# Patient Record
Sex: Female | Born: 2005 | Hispanic: Yes | Marital: Single | Smoking: Never smoker
Health system: Southern US, Community
[De-identification: ages and names within clinical notes are randomized; demographics above are authoritative.]

## PROBLEM LIST (undated history)

## (undated) ENCOUNTER — Inpatient Hospital Stay (HOSPITAL_COMMUNITY): Payer: Self-pay

## (undated) ENCOUNTER — Emergency Department: Admission: EM | Payer: Medicaid Other | Source: Home / Self Care

## (undated) ENCOUNTER — Inpatient Hospital Stay: Payer: Self-pay

## (undated) DIAGNOSIS — J45909 Unspecified asthma, uncomplicated: Secondary | ICD-10-CM

## (undated) DIAGNOSIS — B9689 Other specified bacterial agents as the cause of diseases classified elsewhere: Secondary | ICD-10-CM

## (undated) HISTORY — DX: Unspecified asthma, uncomplicated: J45.909

## (undated) HISTORY — PX: SKIN TAG REMOVAL: SHX780

## (undated) HISTORY — PX: TONSILLECTOMY: SUR1361

---
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
## 2023-05-23 ENCOUNTER — Other Ambulatory Visit: Payer: Self-pay | Admitting: Obstetrics and Gynecology

## 2023-05-23 ENCOUNTER — Encounter: Payer: Self-pay | Admitting: Obstetrics and Gynecology

## 2023-05-23 MED ORDER — MISOPROSTOL 100 MCG PO TABS: 100.0000 ug | ORAL_TABLET | Freq: Once | ORAL | 0 refills | Status: DC

## 2023-05-23 NOTE — Progress Notes (Signed)
Rx cytotec for iUD placement

## 2023-05-24 ENCOUNTER — Ambulatory Visit (INDEPENDENT_AMBULATORY_CARE_PROVIDER_SITE_OTHER): Payer: MEDICAID | Admitting: Obstetrics and Gynecology

## 2023-05-24 ENCOUNTER — Encounter: Payer: Self-pay | Admitting: Obstetrics and Gynecology

## 2023-05-24 ENCOUNTER — Other Ambulatory Visit (HOSPITAL_COMMUNITY)
Admission: RE | Admit: 2023-05-24 | Discharge: 2023-05-24 | Disposition: A | Discharge status: Home / Self Care | Payer: MEDICAID | Source: Ambulatory Visit | Attending: Obstetrics and Gynecology | Admitting: Obstetrics and Gynecology

## 2023-05-24 VITALS — BP 106/72 | Ht 61.0 in | Wt 149.0 lb

## 2023-05-24 DIAGNOSIS — Z30430 Encounter for insertion of intrauterine contraceptive device: Secondary | ICD-10-CM

## 2023-05-24 DIAGNOSIS — Z113 Encounter for screening for infections with a predominantly sexual mode of transmission: Secondary | ICD-10-CM | POA: Diagnosis present

## 2023-05-24 MED ORDER — PARAGARD INTRAUTERINE COPPER IU IUD
1.0000 | INTRAUTERINE_SYSTEM | Freq: Once | INTRAUTERINE | Status: AC
  Administered 2023-05-24: 1 via INTRAUTERINE

## 2023-05-24 NOTE — Telephone Encounter (Signed)
Called pt and she is aware  ?

## 2023-05-24 NOTE — Patient Instructions (Addendum)
I value your feedback and you entrusting us with your care. If you get a [facility] patient survey, I would appreciate you taking the time to let us know about your experience today. Thank you!  Instructions after IUD insertion  Most women experience no significant problems after insertion of an IUD, however minor cramping and spotting for a few days is common. Cramps may be treated with ibuprofen 800mg every 8 hours or Tylenol 650 mg every 4 hours. Contact [facility] immediately if you experience any of the following symptoms during the next week: temperature >99.6 degrees, worsening pelvic pain, abdominal pain, fainting, unusually heavy vaginal bleeding, foul vaginal discharge, or if you think you have expelled the IUD. Nothing inserted in the vagina for 48 hours. You will be scheduled for a follow up visit in approximately four weeks.    

## 2023-05-24 NOTE — Progress Notes (Signed)
   Chief Complaint  Patient presents with   IUD insertion     IUD PROCEDURE NOTE:  Karen Choi is a 17 y.o. G0P0000 here for Paragard  IUD insertion for [facility]. Pt is sexually active with new partner, usually using condoms. Menses are monthly, lasting 5-6 days. Pelvic pain from 3/24 resolved. Pt thinks more menstrual cramp related. Plans to go in army.   BP 106/72   Ht 5\' 1"  (1.549 m)   Wt 149 lb (67.6 kg)   LMP 05/21/2023 (Exact Date)   BMI 28.15 kg/m   IUD Insertion Procedure Note Patient identified, informed consent performed, consent signed.   Discussed risks of irregular bleeding, cramping, infection, malpositioning or misplacement of the IUD outside the uterus which may require further procedure such as laparoscopy, risk of failure <1%. Time out was performed.  Speculum placed in the vagina.  Cervix visualized.  Cleaned with Betadine x 2.  Grasped anteriorly with a single tooth tenaculum.  Uterus sounded to 8.0 cm.   IUD placed per manufacturer's recommendations.  Strings trimmed to 3 cm. Tenaculum was removed, good hemostasis noted.  Patient tolerated procedure well.   ASSESSMENT:  Encounter for insertion of ParaGard IUD - Plan: paragard intrauterine copper IUD 1 each  Screening for STD (sexually transmitted disease) - Plan: Cervicovaginal ancillary only   Meds ordered this encounter  Medications   paragard intrauterine copper IUD 1 each     Plan:  Patient was given post-procedure instructions.  Karen Choi was advised to have backup contraception for one week.   Call if you are having increasing pain, cramps or bleeding or if you have a fever greater than 100.4 degrees F., shaking chills, nausea or vomiting. Patient was also asked to check IUD strings periodically and follow up in 4 weeks for IUD check.  Return in about 4 weeks (around 06/21/2023) for IUD f/u.  Lorrin Bodner B. Savon Cobbs, PA-C 05/24/2023 3:38 PM

## 2023-05-26 LAB — CERVICOVAGINAL ANCILLARY ONLY
Chlamydia: NEGATIVE
Comment: NEGATIVE
Comment: NORMAL
Neisseria Gonorrhea: NEGATIVE

## 2023-06-15 NOTE — Progress Notes (Unsigned)
   No chief complaint on file.    History of Present Illness:  Karen Choi is a 17 y.o. that had a Paragard IUD placed approximately 1 month ago. Since that time, she denies dyspareunia, pelvic pain, non-menstrual bleeding, vaginal d/c, heavy bleeding.   Review of Systems  Physical Exam:  LMP 05/21/2023 (Exact Date)  There is no height or weight on file to calculate BMI.  Pelvic exam:  Two IUD strings {DESC; PRESENT/ABSENT:17923} seen coming from the cervical os. EGBUS, vaginal vault and cervix: within normal limits   Assessment:   No diagnosis found.  IUD strings present in proper location; pt doing well  Plan: F/u if any signs of infection or can no longer feel the strings.   Karen Corwin B. Anacaren Kohan, PA-C 06/15/2023 3:02 PM

## 2023-06-16 ENCOUNTER — Encounter: Payer: Self-pay | Admitting: Obstetrics and Gynecology

## 2023-06-16 ENCOUNTER — Ambulatory Visit (INDEPENDENT_AMBULATORY_CARE_PROVIDER_SITE_OTHER): Payer: MEDICAID | Admitting: Obstetrics and Gynecology

## 2023-06-16 VITALS — BP 100/78 | Ht 61.0 in | Wt 151.0 lb

## 2023-06-16 DIAGNOSIS — R350 Frequency of micturition: Secondary | ICD-10-CM | POA: Diagnosis not present

## 2023-06-16 DIAGNOSIS — R1032 Left lower quadrant pain: Secondary | ICD-10-CM | POA: Diagnosis not present

## 2023-06-16 DIAGNOSIS — Z30431 Encounter for routine checking of intrauterine contraceptive device: Secondary | ICD-10-CM | POA: Diagnosis not present

## 2023-06-16 DIAGNOSIS — Z3202 Encounter for pregnancy test, result negative: Secondary | ICD-10-CM

## 2023-06-16 LAB — POCT URINALYSIS DIPSTICK
Bilirubin, UA: NEGATIVE
Blood, UA: NEGATIVE
Glucose, UA: NEGATIVE
Ketones, UA: NEGATIVE
Nitrite, UA: NEGATIVE
Protein, UA: NEGATIVE
Spec Grav, UA: 1.015 (ref 1.010–1.025)
pH, UA: 7 (ref 5.0–8.0)

## 2023-06-16 LAB — POCT URINE PREGNANCY: Preg Test, Ur: NEGATIVE

## 2023-06-16 NOTE — Patient Instructions (Signed)
I value your feedback and you entrusting us with your care. If you get a [facility] patient survey, I would appreciate you taking the time to let us know about your experience today. Thank you! ? ? ?

## 2023-06-18 LAB — URINE CULTURE

## 2023-06-21 ENCOUNTER — Ambulatory Visit: Payer: MEDICAID | Admitting: Obstetrics and Gynecology

## 2023-10-28 ENCOUNTER — Telehealth: Payer: Self-pay

## 2023-10-28 NOTE — Telephone Encounter (Signed)
Pt calling triage asking if a Rx needs to be called in for IUD removal. Advised no.

## 2023-11-11 ENCOUNTER — Ambulatory Visit: Payer: MEDICAID

## 2023-11-11 VITALS — BP 116/78 | HR 109 | Ht 61.0 in | Wt 137.6 lb

## 2023-11-11 DIAGNOSIS — Z30432 Encounter for removal of intrauterine contraceptive device: Secondary | ICD-10-CM | POA: Diagnosis not present

## 2023-11-11 DIAGNOSIS — Z3009 Encounter for other general counseling and advice on contraception: Secondary | ICD-10-CM

## 2023-11-11 MED ORDER — NORETHIN ACE-ETH ESTRAD-FE 1-20 MG-MCG PO TABS
1.0000 | ORAL_TABLET | Freq: Every day | ORAL | 11 refills | Status: DC
Start: 2023-11-11 — End: 2024-01-30

## 2023-11-11 NOTE — Progress Notes (Signed)
    GYNECOLOGY CLINIC PROCEDURE NOTE  [name] is a 18 y.o. G0P0000 here for Paragard IUD removal. No GYN concerns.    IUD Removal  Patient was in the dorsal lithotomy position, normal external genitalia was noted.  A speculum was placed in the patient's vagina, normal discharge was noted, no lesions. The multiparous cervix was visualized, no lesions, no abnormal discharge.  The strings of the IUD were grasped and pulled using ring forceps. The IUD was removed in its entirety.   Patient tolerated the procedure well.    Patient will use COCs for contraception. She has previously used COCs and was happy with them. Provided with prescription for Junel and reviewed need for back up contraception for 7 days.   Burney Gauze, [place] 11/11/2023 10:57 AM

## 2024-01-30 ENCOUNTER — Other Ambulatory Visit (HOSPITAL_COMMUNITY)
Admission: RE | Admit: 2024-01-30 | Discharge: 2024-01-30 | Disposition: A | Discharge status: Home / Self Care | Payer: MEDICAID | Source: Ambulatory Visit

## 2024-01-30 ENCOUNTER — Ambulatory Visit: Payer: MEDICAID

## 2024-01-30 VITALS — BP 104/70 | HR 81 | Ht 61.0 in | Wt 133.8 lb

## 2024-01-30 DIAGNOSIS — N898 Other specified noninflammatory disorders of vagina: Secondary | ICD-10-CM

## 2024-01-30 DIAGNOSIS — Z3201 Encounter for pregnancy test, result positive: Secondary | ICD-10-CM | POA: Diagnosis not present

## 2024-01-30 DIAGNOSIS — R350 Frequency of micturition: Secondary | ICD-10-CM | POA: Diagnosis not present

## 2024-01-30 DIAGNOSIS — N912 Amenorrhea, unspecified: Secondary | ICD-10-CM

## 2024-01-30 LAB — POCT URINE PREGNANCY: Preg Test, Ur: POSITIVE — AB

## 2024-01-30 LAB — POCT URINALYSIS DIPSTICK
Bilirubin, UA: NEGATIVE
Blood, UA: NEGATIVE
Glucose, UA: NEGATIVE
Ketones, UA: NEGATIVE
Leukocytes, UA: NEGATIVE
Nitrite, UA: NEGATIVE
Protein, UA: NEGATIVE
Spec Grav, UA: 1.01 (ref 1.010–1.025)
Urobilinogen, UA: 0.2 U/dL
pH, UA: 7 (ref 5.0–8.0)

## 2024-01-30 NOTE — Addendum Note (Signed)
 Addended by: Juanita Norlander on: 01/30/2024 11:41 AM   Modules accepted: Orders

## 2024-01-30 NOTE — Progress Notes (Signed)
    NURSE VISIT NOTE  Subjective:    Patient ID: Kennedi E Sanchez Rivera, female    DOB: 2005-12-17, 18 y.o.   MRN: [id number]  HPI  Patient is a 18 y.o. G0P0000 female who presents for evaluation of amenorrhea. She believes she could be pregnant. Pregnancy is not desired. Sexual Activity: has sex with males. Current symptoms also include: breast tenderness, urinary frequency, moodiness and fatigue.  LMP 12/25/23 Last period was abnormal - only 4 days long which is short for patient.  She also reports a foul vaginal odor and urinary frequency.    Objective:    BP 104/70   Pulse 81   Ht 5\' 1"  (1.549 m)   Wt 133 lb 12.8 oz (60.7 kg)   LMP 12/25/2023 (Exact Date)   BMI 25.28 kg/m   Lab Review  Results for orders placed or performed in visit on 01/30/24  POCT urine pregnancy  Result Value Ref Range   Preg Test, Ur Positive (A) Negative  POCT Urinalysis Dipstick  Result Value Ref Range   Color, UA     Clarity, UA     Glucose, UA Negative Negative   Bilirubin, UA negative    Ketones, UA negative    Spec Grav, UA 1.010 1.010 - 1.025   Blood, UA negative    pH, UA 7.0 5.0 - 8.0   Protein, UA Negative Negative   Urobilinogen, UA 0.2 0.2 or 1.0 E.U./dL   Nitrite, UA negative    Leukocytes, UA Negative Negative   Appearance     Odor      Assessment:   1. Amenorrhea   2. Vaginal odor   3. Urinary frequency     Plan:   Pregnancy Test: Positive  Estimated Date of Delivery: 09/30/24 Patient is unsure whether she wants to continue the pregnancy.  She is 5 weeks and 1 day by her LMP.  Counseled on pregnancy options.  Information provided for local abortion clinics.  Reviewed state law with patient - she understands she will need to decide by 12 weeks and 6 days if she would like to terminate in state.  Discussed risk reduction in pregnancy - taking a prenatal vitamin, not smoking or drinking or taking OTC medications until consulting with a provider or pharmacist until she  decides whether she would like to continue the pregnancy.  If she decides to continue, she will schedule her nurse visit @ 7-[redacted] wks pregnant, u/s for dating @10  wk, and NOB visit at [redacted] wk pregnant.     Vaginal odor and Urinary frequency: UA performed in clinic - within normal limits. Aptima Swab sent out.  Will call patient with any abnormal results.   Juanita Norlander, RN

## 2024-01-31 ENCOUNTER — Encounter (HOSPITAL_COMMUNITY): Payer: Self-pay

## 2024-01-31 ENCOUNTER — Other Ambulatory Visit: Payer: Self-pay

## 2024-01-31 ENCOUNTER — Inpatient Hospital Stay (HOSPITAL_COMMUNITY)
Admission: AD | Admit: 2024-01-31 | Discharge: 2024-01-31 | Disposition: A | Discharge status: Home / Self Care | Payer: MEDICAID | Attending: Obstetrics and Gynecology | Admitting: Obstetrics and Gynecology

## 2024-01-31 DIAGNOSIS — O26891 Other specified pregnancy related conditions, first trimester: Secondary | ICD-10-CM | POA: Diagnosis not present

## 2024-01-31 DIAGNOSIS — O23591 Infection of other part of genital tract in pregnancy, first trimester: Secondary | ICD-10-CM | POA: Diagnosis not present

## 2024-01-31 DIAGNOSIS — R109 Unspecified abdominal pain: Secondary | ICD-10-CM

## 2024-01-31 DIAGNOSIS — B9689 Other specified bacterial agents as the cause of diseases classified elsewhere: Secondary | ICD-10-CM | POA: Insufficient documentation

## 2024-01-31 DIAGNOSIS — Z3A01 Less than 8 weeks gestation of pregnancy: Secondary | ICD-10-CM | POA: Diagnosis not present

## 2024-01-31 DIAGNOSIS — R102 Pelvic and perineal pain: Secondary | ICD-10-CM | POA: Insufficient documentation

## 2024-01-31 LAB — CERVICOVAGINAL ANCILLARY ONLY
Bacterial Vaginitis (gardnerella): POSITIVE — AB
Candida Glabrata: NEGATIVE
Candida Vaginitis: NEGATIVE
Chlamydia: NEGATIVE
Comment: NEGATIVE
Comment: NEGATIVE
Comment: NEGATIVE
Comment: NEGATIVE
Comment: NEGATIVE
Comment: NORMAL
Neisseria Gonorrhea: NEGATIVE
Trichomonas: NEGATIVE

## 2024-01-31 LAB — URINALYSIS, ROUTINE W REFLEX MICROSCOPIC
Bacteria, UA: NONE SEEN
Bilirubin Urine: NEGATIVE
Glucose, UA: NEGATIVE mg/dL
Hgb urine dipstick: NEGATIVE
Ketones, ur: 20 mg/dL — AB
Leukocytes,Ua: NEGATIVE
Nitrite: NEGATIVE
Protein, ur: NEGATIVE mg/dL
Specific Gravity, Urine: 1.023 (ref 1.005–1.030)
pH: 6 (ref 5.0–8.0)

## 2024-01-31 LAB — WET PREP, GENITAL
Sperm: NONE SEEN
Trich, Wet Prep: NONE SEEN
WBC, Wet Prep HPF POC: <10 (ref <10)
Yeast Wet Prep HPF POC: NONE SEEN

## 2024-01-31 MED ORDER — METRONIDAZOLE 500 MG PO TABS: 500.0000 mg | ORAL_TABLET | Freq: Two times a day (BID) | ORAL | 0 refills | Status: DC

## 2024-01-31 NOTE — [facility]U Note (Signed)
 Karen Choi is a 18 y.o. at [redacted]w[redacted]d here in [facility]U reporting: cramping after pregnancy test yesterday. Denies VB. Reports white creamy discharge. Patient very tearful, unsure if she wants to keep baby. She states she doesn't know how far along she is because her last period was shorter than usual.   LMP: 12/25/2023  Pain score: 7/10 Vitals:   01/31/24 1634  BP: 118/75  Pulse: 95  Temp: 98.4 F (36.9 C)  SpO2: 100%      Lab orders placed from triage: UA, swabs

## 2024-01-31 NOTE — MAU Provider Note (Signed)
 None     S [name] is a 18 y.o. G1P0000 pregnant/non-pregnant female at [redacted]w[redacted]d who presents to MAU today with complaint of abdominal cramping. Saw [name] yesterday for amenorrhea evaluation and found out she is pregnant. Reports vaginal odor/discharge as well.  Receives care at [facility]. Prenatal records reviewed.  Pertinent items noted in HPI and remainder of comprehensive ROS otherwise negative.   O BP 118/75 (BP Location: Right Arm)   Pulse 95   Temp 98.4 F (36.9 C) (Oral)   LMP 12/25/2023 (Exact Date)   SpO2 100%  Physical Exam Vitals reviewed.  Constitutional:      General: She is not in acute distress.    Appearance: She is well-developed. She is not diaphoretic.  Eyes:     General: No scleral icterus. Pulmonary:     Effort: Pulmonary effort is normal. No respiratory distress.  Skin:    General: Skin is warm and dry.  Neurological:     Mental Status: She is alert.     Coordination: Coordination normal.  Psychiatric:        Mood and Affect: Affect is tearful.    Results for orders placed or performed during the hospital encounter of 01/31/24 (from the past 24 hours)  Urinalysis, Routine w reflex microscopic -Urine, Clean Catch     Status: Abnormal   Collection Time: 01/31/24  4:37 PM  Result Value Ref Range   Color, Urine YELLOW YELLOW   APPearance HAZY (A) CLEAR   Specific Gravity, Urine 1.023 1.005 - 1.030   pH 6.0 5.0 - 8.0   Glucose, UA NEGATIVE NEGATIVE mg/dL   Hgb urine dipstick NEGATIVE NEGATIVE   Bilirubin Urine NEGATIVE NEGATIVE   Ketones, ur 20 (A) NEGATIVE mg/dL   Protein, ur NEGATIVE NEGATIVE mg/dL   Nitrite NEGATIVE NEGATIVE   Leukocytes,Ua NEGATIVE NEGATIVE   RBC / HPF 0-5 0 - 5 RBC/hpf   WBC, UA 0-5 0 - 5 WBC/hpf   Bacteria, UA NONE SEEN NONE SEEN   Squamous Epithelial / HPF 0-5 0 - 5 /HPF   Mucus PRESENT   Wet prep, genital     Status: Abnormal   Collection Time: 01/31/24  4:38 PM   Specimen: Urine, Clean  Catch  Result Value Ref Range   Yeast Wet Prep HPF POC NONE SEEN NONE SEEN   Trich, Wet Prep NONE SEEN NONE SEEN   Clue Cells Wet Prep HPF POC PRESENT (A) NONE SEEN   WBC, Wet Prep HPF POC <10 <10   Sperm NONE SEEN     MDM: Low MAU Course: -Wet prep positive for BV. Treat with metronidazole .   A 1. Abdominal cramping (Primary) - Discharge patient  2. BV (bacterial vaginosis) - Discharge patient  3. [redacted] weeks gestation of pregnancy - Discharge patient   Medical screening exam complete  P Counseled on pregnancy dating based on LMP. Discharge from MAU in stable condition with first trimester return precautions. Follow up as directed by OBGYN.  No future appointments. Allergies as of 01/31/2024   No Known Allergies      Medication List     TAKE these medications    fluticasone 50 MCG/ACT nasal spray Commonly known as: FLONASE Place 2 sprays into both nostrils 2 (two) times daily.   metroNIDAZOLE  500 MG tablet Commonly known as: FLAGYL  Take 1 tablet (500 mg total) by mouth 2 (two) times daily.   Ventolin HFA 108 (90 Base) MCG/ACT inhaler Generic drug: albuterol   ZYRTEC  PO Take by mouth.       Noreene Bearded, PA

## 2024-01-31 NOTE — Discharge Instructions (Addendum)
 Please reach out to your OB if you have any questions. We are happy to do whatever we can for you as you decide what is right for you.   Pregnancy is dated based on the first day of your most recent period. This puts you at 5 weeks, 2 days as of today.

## 2024-02-01 LAB — GC/CHLAMYDIA PROBE AMP (~~LOC~~) NOT AT ARMC
Chlamydia: NEGATIVE
Comment: NEGATIVE
Comment: NORMAL
Neisseria Gonorrhea: NEGATIVE

## 2024-02-03 ENCOUNTER — Ambulatory Visit: Payer: Self-pay | Admitting: Obstetrics and Gynecology

## 2024-02-10 ENCOUNTER — Ambulatory Visit (INDEPENDENT_AMBULATORY_CARE_PROVIDER_SITE_OTHER): Payer: MEDICAID

## 2024-02-10 ENCOUNTER — Other Ambulatory Visit (HOSPITAL_COMMUNITY)
Admission: RE | Admit: 2024-02-10 | Discharge: 2024-02-10 | Disposition: A | Discharge status: Home / Self Care | Payer: MEDICAID | Source: Ambulatory Visit | Attending: Licensed Practical Nurse | Admitting: Licensed Practical Nurse

## 2024-02-10 VITALS — BP 107/72 | HR 84 | Ht 60.5 in | Wt 130.0 lb

## 2024-02-10 DIAGNOSIS — N898 Other specified noninflammatory disorders of vagina: Secondary | ICD-10-CM

## 2024-02-10 NOTE — Progress Notes (Signed)
    NURSE VISIT NOTE  Subjective:    Patient ID: Karen Choi, female    DOB: 2006/07/12, 18 y.o.   MRN: [id number]  HPI  Patient is a 18 y.o. G78P0000 female who presents for darker thick vaginal discharge for 1 day(s). RecentlyPatient denies history of known exposure to STD.   Objective:    BP 107/72   Pulse 84   Ht 5' 0.5" (1.537 m)   Wt 130 lb (59 kg)   LMP 12/25/2023 (Exact Date)   BMI 24.97 kg/m      Assessment:   1. Vaginal discharge   2. Vagina itching       Plan:   GC and chlamydia DNA  probe sent to lab. Treatment: OTC yeast cream such as Monistat or Gyne-Lotrimin and await for results for further treatment.  ROV prn if symptoms persist or worsen.   Vale Garrison, CMA

## 2024-02-13 ENCOUNTER — Encounter: Payer: Self-pay | Admitting: Obstetrics and Gynecology

## 2024-02-14 ENCOUNTER — Ambulatory Visit: Payer: Self-pay

## 2024-02-14 ENCOUNTER — Other Ambulatory Visit: Payer: Self-pay

## 2024-02-14 DIAGNOSIS — B379 Candidiasis, unspecified: Secondary | ICD-10-CM

## 2024-02-14 LAB — CERVICOVAGINAL ANCILLARY ONLY
Bacterial Vaginitis (gardnerella): NEGATIVE
Candida Glabrata: NEGATIVE
Candida Vaginitis: POSITIVE — AB
Chlamydia: NEGATIVE
Comment: NEGATIVE
Comment: NEGATIVE
Comment: NEGATIVE
Comment: NEGATIVE
Comment: NEGATIVE
Comment: NORMAL
Neisseria Gonorrhea: NEGATIVE
Trichomonas: NEGATIVE

## 2024-02-14 MED ORDER — FLUCONAZOLE 150 MG PO TABS: 150.0000 mg | ORAL_TABLET | Freq: Every day | ORAL | 0 refills | Status: DC

## 2024-03-05 ENCOUNTER — Other Ambulatory Visit (HOSPITAL_COMMUNITY)
Admission: RE | Admit: 2024-03-05 | Discharge: 2024-03-05 | Disposition: A | Discharge status: Home / Self Care | Payer: MEDICAID | Source: Ambulatory Visit | Attending: Obstetrics and Gynecology | Admitting: Obstetrics and Gynecology

## 2024-03-05 ENCOUNTER — Ambulatory Visit (INDEPENDENT_AMBULATORY_CARE_PROVIDER_SITE_OTHER): Payer: MEDICAID

## 2024-03-05 VITALS — BP 108/70 | HR 91 | Ht 61.5 in | Wt 128.4 lb

## 2024-03-05 DIAGNOSIS — N898 Other specified noninflammatory disorders of vagina: Secondary | ICD-10-CM

## 2024-03-05 NOTE — Progress Notes (Addendum)
    NURSE VISIT NOTE  Subjective:    Patient ID: Karen Choi, female    DOB: [birth year], 18 y.o.   MRN: [id number]  HPI  Patient is a 18 y.o. G58P0000 female who presents for  vaginal itching for 1.5 week(s). Denies abnormal vaginal bleeding or significant pelvic pain or fever.  Objective:    BP 108/70   Pulse 91   Ht 5' 1.5 (1.562 m)   Wt 128 lb 6.4 oz (58.2 kg)   LMP 12/25/2023 (Exact Date)   Breastfeeding Unknown   BMI 23.87 kg/m     Assessment:   1. Vaginal discharge   2. Vagina itching       Plan:   GC and chlamydia DNA  probe sent to lab. Treatment: await results for further treatment.  ROV prn if symptoms persist or worsen.   Harlene LITTIE Cisco, CNM

## 2024-03-06 LAB — CERVICOVAGINAL ANCILLARY ONLY
Bacterial Vaginitis (gardnerella): POSITIVE — AB
Candida Glabrata: NEGATIVE
Candida Vaginitis: NEGATIVE
Chlamydia: NEGATIVE
Comment: NEGATIVE
Comment: NEGATIVE
Comment: NEGATIVE
Comment: NEGATIVE
Comment: NEGATIVE
Comment: NORMAL
Neisseria Gonorrhea: NEGATIVE
Trichomonas: NEGATIVE

## 2024-03-07 ENCOUNTER — Ambulatory Visit: Payer: Self-pay

## 2024-03-07 ENCOUNTER — Other Ambulatory Visit: Payer: Self-pay

## 2024-03-07 DIAGNOSIS — B3731 Acute candidiasis of vulva and vagina: Secondary | ICD-10-CM

## 2024-03-07 MED ORDER — FLUCONAZOLE 150 MG PO TABS: 150.0000 mg | ORAL_TABLET | Freq: Once | ORAL | 0 refills | Status: AC

## 2024-03-08 MED ORDER — CLINDAMYCIN HCL 300 MG PO CAPS: 300.0000 mg | ORAL_CAPSULE | Freq: Two times a day (BID) | ORAL | 0 refills | Status: DC

## 2024-03-21 ENCOUNTER — Encounter: Payer: Self-pay | Admitting: Obstetrics and Gynecology

## 2024-03-21 ENCOUNTER — Ambulatory Visit: Payer: MEDICAID

## 2024-03-21 VITALS — BP 110/67 | HR 93 | Ht 60.5 in | Wt 130.0 lb

## 2024-03-21 DIAGNOSIS — N898 Other specified noninflammatory disorders of vagina: Secondary | ICD-10-CM

## 2024-03-21 NOTE — Progress Notes (Addendum)
    NURSE VISIT NOTE  Subjective:    Patient ID: Karen Choi, female    DOB: 10/24/2005, 18 y.o.   MRN: [id number]  HPI  Patient is a 18 y.o. G61P0000 female who presents for vaginal itching Denies discharge states no other symptom.Pt stated she went to abortion clinic on June 3rd states she hasn't had a normal period since was advised by office RN that can be normal, I let her know we would do the mycoplasma swab as she keeps getting Bv and itchy .Pt was also advised to schedule apt with provider as she states she does not  have a pcp, information for PCP was also given. Pt verbalized understanding.  Objective:    BP 110/67   Pulse 93   Ht 5' 0.5 (1.537 m)   Wt 130 lb (59 kg)   LMP 12/25/2023 (Exact Date)   Breastfeeding No   BMI 24.97 kg/m     Assessment:   1. Vagina itching     Plan:   GC and chlamydia DNA  probe sent to lab. Treatment: abstain from coitus during course of treatment ROV prn if symptoms persist or worsen.   Carnel Stegman H Munachimso Rigdon, CMA

## 2024-03-24 LAB — NUSWAB VG PLUS+MYCOPLASMAS,NAA
Candida albicans, NAA: NEGATIVE
Candida glabrata, NAA: NEGATIVE
Chlamydia trachomatis, NAA: NEGATIVE
Mycoplasma genitalium NAA: NEGATIVE
Mycoplasma hominis NAA: NEGATIVE
Neisseria gonorrhoeae, NAA: NEGATIVE
Trich vag by NAA: NEGATIVE
Ureaplasma spp NAA: POSITIVE — AB

## 2024-03-26 ENCOUNTER — Other Ambulatory Visit: Payer: Self-pay

## 2024-03-26 DIAGNOSIS — Z2239 Carrier of other specified bacterial diseases: Secondary | ICD-10-CM

## 2024-03-26 MED ORDER — DOXYCYCLINE HYCLATE 100 MG PO CAPS: 100.0000 mg | ORAL_CAPSULE | Freq: Two times a day (BID) | ORAL | 0 refills | Status: DC

## 2024-03-26 MED ORDER — DOXYCYCLINE HYCLATE 100 MG PO CAPS: 100.0000 mg | ORAL_CAPSULE | Freq: Two times a day (BID) | ORAL | 1 refills | Status: DC

## 2024-03-26 NOTE — Telephone Encounter (Signed)
 Pt spoke with Triage nurse regarding these questions.

## 2024-03-26 NOTE — Telephone Encounter (Signed)
 Patient called. Patient aware. Prescriptions called in to treat partner. She denies partner has any allergies to medications.

## 2024-04-03 ENCOUNTER — Encounter: Payer: Self-pay | Admitting: Obstetrics and Gynecology

## 2024-04-03 ENCOUNTER — Ambulatory Visit (INDEPENDENT_AMBULATORY_CARE_PROVIDER_SITE_OTHER): Payer: MEDICAID | Admitting: Obstetrics and Gynecology

## 2024-04-03 ENCOUNTER — Ambulatory Visit: Payer: MEDICAID

## 2024-04-03 VITALS — BP 103/61 | HR 76 | Ht 60.5 in | Wt 130.0 lb

## 2024-04-03 DIAGNOSIS — Z3202 Encounter for pregnancy test, result negative: Secondary | ICD-10-CM

## 2024-04-03 DIAGNOSIS — N898 Other specified noninflammatory disorders of vagina: Secondary | ICD-10-CM | POA: Diagnosis not present

## 2024-04-03 DIAGNOSIS — R350 Frequency of micturition: Secondary | ICD-10-CM | POA: Diagnosis not present

## 2024-04-03 DIAGNOSIS — Z2239 Carrier of other specified bacterial diseases: Secondary | ICD-10-CM

## 2024-04-03 DIAGNOSIS — R102 Pelvic and perineal pain: Secondary | ICD-10-CM

## 2024-04-03 LAB — POCT URINALYSIS DIPSTICK
Bilirubin, UA: NEGATIVE
Blood, UA: NEGATIVE
Glucose, UA: NEGATIVE
Ketones, UA: NEGATIVE
Leukocytes, UA: NEGATIVE
Nitrite, UA: NEGATIVE
Protein, UA: NEGATIVE
Spec Grav, UA: 1.02 (ref 1.010–1.025)
pH, UA: 6.5 (ref 5.0–8.0)

## 2024-04-03 LAB — POCT URINE PREGNANCY: Preg Test, Ur: NEGATIVE

## 2024-04-03 NOTE — Patient Instructions (Addendum)
I value your feedback and you entrusting us with your care. If you get a [facility] patient survey, I would appreciate you taking the time to let us know about your experience today. Thank you!  HEALTHY VAGINAL HYGIENE  AVOID   Panytyhose Synthetic underwear (wear COTTON underwear)  Tight pants/jeans Thongs Pantyliners Scented soaps/shower gels (use Dove Sensitive Skin soap or water to clean) Bubble bath/bath bombs Scented detergents  ALL dryer sheets (line dry underwear if using them on your other clothing) Feminine sprays/douches   FOR RECURRENT BACTERIAL VAGINOSIS (BV) Above recommendations and ADD probiotics daily, USE CONDOMS  Visit [email or web address]    

## 2024-04-03 NOTE — Progress Notes (Signed)
 Karen Ao, Karen Choi   Chief Complaint  Patient presents with   Pelvic Pain    Entire area, some vag itching, no discharge or odor   Urinary Tract Infection    Frequent urination, no burning.    HPI:      Karen Choi is a 18 y.o. G1P0010 whose LMP was Patient's last menstrual period was 03/24/2024 (exact date)., presents today for pelvic pain for over a month. Sx are sharp, stabbing, intermittent, lasting about 15 minutes several times a day. Certain movements/bending over trigger sx; no allev factors. No relief after ureaplasma tx with doxy. Also with diarrhea for a few wks, started before doxy use. Can have watery stools, no n/v. Uses MJ daily. Also with urinary frequency/urgency for the past 4 days, no dysuria/hematuria. Is having LBP, no fevers. Drinks 3 sodas/caffeine daily and adequate water.  Pt s/p EAB with pills 02/14/24 with subsequent bleeding. LMP was a little lighter than usual, no dysmen.  Hx of recurrent vaginitis, no new partners. Ureaplasma on 7/25 nuswab, no BV/yeast/STDs. Treated with doxy (took all but 2 pills due to vomiting after taking it). Partner completing tx, no sexual activity since she started doxy. Due for TOC. Has some ext vag itching occas; no increased d/c or odor currently. Uses scented soap, dryer sheets, synthetic underwear.  6/25 and 01/30/24 with Gardnerella BV  02/10/24 with yeast  Neg GYN u/s 3/24  Past Medical History:  Diagnosis Date   Asthma    Chlamydia 05/15/2022    Past Surgical History:  Procedure Laterality Date   TONSILLECTOMY      Family History  Problem Relation Age of Onset   Breast cancer Mother        maybe 39   Hypertension Father    Cancer - Cervical Maternal Grandmother        unsure of age    Social History   Socioeconomic History   Marital status: Single    Spouse name: Not on file   Number of children: Not on file   Years of education: Not on file   Highest education level: Not on file   Occupational History   Not on file  Tobacco Use   Smoking status: Never   Smokeless tobacco: Never  Vaping Use   Vaping status: Some Days  Substance and Sexual Activity   Alcohol use: Not Currently   Drug use: Not Currently    Frequency: 2.0 times per week    Types: Marijuana   Sexual activity: Not Currently    Birth control/protection: None  Other Topics Concern   Not on file  Social History Narrative   Not on file   Social Drivers of Health   Financial Resource Strain: Not on file  Food Insecurity: Not on file  Transportation Needs: Not on file  Physical Activity: Not on file  Stress: Not on file  Social Connections: Not on file  Intimate Partner Violence: Not on file    Outpatient Medications Prior to Visit  Medication Sig Dispense Refill   cetirizine (ZYRTEC) 10 MG tablet TAKE 1 TABLET BY MOUTH ONCE DAILY FOR ALLERGIES     Cetirizine HCl (ZYRTEC PO) Take by mouth.     clindamycin  (CLEOCIN ) 300 MG capsule Take 1 capsule (300 mg total) by mouth 2 (two) times daily. For seven days (Patient not taking: Reported on 03/21/2024) 14 capsule 0   doxycycline  (VIBRAMYCIN ) 100 MG capsule Take 1 capsule (100 mg total) by mouth 2 (two)  times daily. 14 capsule 1   fluconazole  (DIFLUCAN ) 150 MG tablet Take 1 tablet (150 mg total) by mouth daily. (Patient not taking: Reported on 03/21/2024) 1 tablet 0   fluticasone (FLONASE) 50 MCG/ACT nasal spray Place 2 sprays into both nostrils 2 (two) times daily.     metroNIDAZOLE  (FLAGYL ) 500 MG tablet Take 1 tablet (500 mg total) by mouth 2 (two) times daily. (Patient not taking: Reported on 03/21/2024) 14 tablet 0   VENTOLIN HFA 108 (90 Base) MCG/ACT inhaler  (Patient not taking: Reported on 03/21/2024)     No facility-administered medications prior to visit.      ROS:  Review of Systems  Constitutional:  Negative for fever.  Gastrointestinal:  Negative for blood in stool, constipation, diarrhea, nausea and vomiting.  Genitourinary:  Positive  for frequency, pelvic pain and urgency. Negative for dyspareunia, dysuria, flank pain, hematuria, vaginal bleeding, vaginal discharge and vaginal pain.  Musculoskeletal:  Negative for back pain.  Skin:  Negative for rash.   BREAST: No symptoms   OBJECTIVE:   Vitals:  BP 103/61   Pulse 76   Ht 5' 0.5 (1.537 m)   Wt 130 lb (59 kg)   LMP 03/24/2024 (Exact Date)   Breastfeeding No   BMI 24.97 kg/m   Physical Exam Vitals reviewed.  Constitutional:      Appearance: She is well-developed.  Pulmonary:     Effort: Pulmonary effort is normal.  Abdominal:     Palpations: Abdomen is soft.     Tenderness: There is no abdominal tenderness. There is no guarding or rebound.  Genitourinary:    General: Normal vulva.     Pubic Area: No rash.      Labia:        Right: No rash, tenderness or lesion.        Left: No rash, tenderness or lesion.      Vagina: Normal. No vaginal discharge, erythema or tenderness.     Cervix: Normal.     Uterus: Normal. Tender. Not enlarged.      Adnexa:        Right: Tenderness present. No mass.         Left: Tenderness present. No mass.    Musculoskeletal:        General: Normal range of motion.     Cervical back: Normal range of motion.  Skin:    General: Skin is warm and dry.  Neurological:     General: No focal deficit present.     Mental Status: She is alert and oriented to person, place, and time.  Psychiatric:        Mood and Affect: Mood normal.        Behavior: Behavior normal.        Thought Content: Thought content normal.        Judgment: Judgment normal.     Results: Results for orders placed or performed in visit on 04/03/24 (from the past 24 hours)  POCT urine pregnancy     Status: Normal   Collection Time: 04/03/24 10:59 AM  Result Value Ref Range   Preg Test, Ur Negative Negative  POCT Urinalysis Dipstick     Status: Normal   Collection Time: 04/03/24 11:00 AM  Result Value Ref Range   Color, UA yellow    Clarity, UA clear     Glucose, UA Negative Negative   Bilirubin, UA neg    Ketones, UA neg    Spec Grav, UA 1.020 1.010 - 1.025  Blood, UA neg    pH, UA 6.5 5.0 - 8.0   Protein, UA Negative Negative   Urobilinogen, UA     Nitrite, UA neg    Leukocytes, UA Negative Negative   Appearance     Odor       Assessment/Plan: Pelvic pain - Plan: US  PELVIS TRANSVAGINAL NON-OB (TV ONLY), POCT urine pregnancy; neg UPT, check GYN u/s, urine C&S and ureaplasma TOC. Will f/u with results. Also with diarrhea--recommended pt d/c MJ use.   Urinary frequency - Plan: Urine Culture, POCT Urinalysis Dipstick; neg UA. Check C&S. Will f/u if abn.   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 Dove sens skin soap/line dry underwear/cotton underwear in case chem derm.    Return in about 2 days (around 04/05/2024) for GYN u/s for pelvic pain--[facility] to call with results.  Kemberly Taves B. Nirvana Blanchett, PA-C 04/03/2024 11:01 AM

## 2024-04-03 NOTE — Addendum Note (Signed)
 Addended by: WATT HILA B on: 04/03/2024 11:54 AM   Modules accepted: Orders

## 2024-04-06 LAB — GENITAL MYCOPLASMAS NAA, SWAB
Mycoplasma genitalium NAA: NEGATIVE
Mycoplasma hominis NAA: NEGATIVE
Ureaplasma spp NAA: NEGATIVE

## 2024-04-06 LAB — URINE CULTURE

## 2024-04-08 ENCOUNTER — Other Ambulatory Visit: Payer: Self-pay | Admitting: Obstetrics and Gynecology

## 2024-04-08 ENCOUNTER — Ambulatory Visit: Payer: Self-pay | Admitting: Obstetrics and Gynecology

## 2024-04-08 MED ORDER — FLUCONAZOLE 150 MG PO TABS: 150.0000 mg | ORAL_TABLET | Freq: Once | ORAL | 0 refills | Status: AC

## 2024-04-08 NOTE — Progress Notes (Signed)
 Rx diflucan  for vag itch after abx

## 2024-04-10 ENCOUNTER — Ambulatory Visit: Payer: MEDICAID | Admitting: Obstetrics and Gynecology

## 2024-04-16 NOTE — Progress Notes (Unsigned)
   PCP:  Loris Ao, MD   No chief complaint on file.    HPI:      [name] is a 18 y.o. G1P0010 whose LMP was Patient's last menstrual period was 03/24/2024 (exact date)., presents today for her annual examination.  Her menses are {norm/abn:715}, lasting {number: 22536} days.  Dysmenorrhea {dysmen:716}. She {does:18564} have intermenstrual bleeding.  Sex activity: {sex active: 315163}.  Last Pap: N/A due to age Hx of STDs: neg except ureaplasma 7/25; treated and had neg TOC 7/25  There is no FH of breast cancer. There is no FH of ovarian cancer. The patient {does:18564} do self-breast exams.  Tobacco use: {tob:20664} Alcohol use: {Alcohol:11675} No drug use.  Exercise: {exercise:31265}  She {does:18564} get adequate calcium and Vitamin D in her diet.  pelvic pain for over a month. Sx are sharp, stabbing, intermittent, lasting about 15 minutes several times a day. Certain movements/bending over trigger sx; no allev factors. No relief after ureaplasma tx with doxy. Also with diarrhea for a few wks, started before doxy use. Can have watery stools, no n/v. Uses MJ daily. Also with urinary frequency/urgency for the past 4 days, no dysuria/hematuria.   There are no active problems to display for this patient.   Past Surgical History:  Procedure Laterality Date   TONSILLECTOMY      Family History  Problem Relation Age of Onset   Breast cancer Mother        maybe 82   Hypertension Father    Cancer - Cervical Maternal Grandmother        unsure of age    Social History   Socioeconomic History   Marital status: Single    Spouse name: Not on file   Number of children: Not on file   Years of education: Not on file   Highest education level: Not on file  Occupational History   Not on file  Tobacco Use   Smoking status: Never   Smokeless tobacco: Never  Vaping Use   Vaping status: Some Days  Substance and Sexual Activity   Alcohol use: Not  Currently   Drug use: Not Currently    Frequency: 2.0 times per week    Types: Marijuana   Sexual activity: Not Currently    Birth control/protection: None  Other Topics Concern   Not on file  Social History Narrative   Not on file   Social Drivers of Health   Financial Resource Strain: Not on file  Food Insecurity: Not on file  Transportation Needs: Not on file  Physical Activity: Not on file  Stress: Not on file  Social Connections: Not on file  Intimate Partner Violence: Not on file     Current Outpatient Medications:    cetirizine (ZYRTEC) 10 MG tablet, TAKE 1 TABLET BY MOUTH ONCE DAILY FOR ALLERGIES, Disp: , Rfl:      ROS:  Review of Systems BREAST: No symptoms   Objective: LMP 03/24/2024 (Exact Date)    OBGyn Exam  Results: No results found for this or any previous visit (from the past 24 hours).  Assessment/Plan: No diagnosis found.  No orders of the defined types were placed in this encounter.            GYN counsel {counseling: 16159}     F/U  No follow-ups on file.  Karen Raya B. Maximus Hoffert, PA-C 04/16/2024 4:52 PM

## 2024-04-17 ENCOUNTER — Encounter: Payer: Self-pay | Admitting: Obstetrics and Gynecology

## 2024-04-17 ENCOUNTER — Ambulatory Visit (INDEPENDENT_AMBULATORY_CARE_PROVIDER_SITE_OTHER): Payer: MEDICAID | Admitting: Obstetrics and Gynecology

## 2024-04-17 VITALS — BP 97/61 | HR 83 | Ht 60.5 in | Wt 131.0 lb

## 2024-04-17 DIAGNOSIS — Z3009 Encounter for other general counseling and advice on contraception: Secondary | ICD-10-CM

## 2024-04-17 DIAGNOSIS — N898 Other specified noninflammatory disorders of vagina: Secondary | ICD-10-CM

## 2024-04-17 DIAGNOSIS — N760 Acute vaginitis: Secondary | ICD-10-CM | POA: Diagnosis not present

## 2024-04-17 DIAGNOSIS — Z01411 Encounter for gynecological examination (general) (routine) with abnormal findings: Secondary | ICD-10-CM

## 2024-04-17 DIAGNOSIS — R102 Pelvic and perineal pain: Secondary | ICD-10-CM

## 2024-04-17 DIAGNOSIS — B9689 Other specified bacterial agents as the cause of diseases classified elsewhere: Secondary | ICD-10-CM

## 2024-04-17 DIAGNOSIS — Z01419 Encounter for gynecological examination (general) (routine) without abnormal findings: Secondary | ICD-10-CM

## 2024-04-17 LAB — POCT URINALYSIS DIPSTICK
Bilirubin, UA: NEGATIVE
Blood, UA: NEGATIVE
Glucose, UA: NEGATIVE
Ketones, UA: NEGATIVE
Leukocytes, UA: NEGATIVE
Nitrite, UA: NEGATIVE
Protein, UA: NEGATIVE
Spec Grav, UA: 1.01 (ref 1.010–1.025)
Urobilinogen, UA: 0.2 U/dL
pH, UA: 6.5 (ref 5.0–8.0)

## 2024-04-17 LAB — POCT WET PREP WITH KOH
Clue Cells Wet Prep HPF POC: POSITIVE
KOH Prep POC: POSITIVE — AB
Trichomonas, UA: NEGATIVE
Yeast Wet Prep HPF POC: NEGATIVE

## 2024-04-17 MED ORDER — METRONIDAZOLE 500 MG PO TABS: ORAL_TABLET | ORAL | 0 refills | Status: DC

## 2024-04-17 NOTE — Patient Instructions (Addendum)
I value your feedback and you entrusting us with your care. If you get a [facility] patient survey, I would appreciate you taking the time to let us know about your experience today. Thank you!  HEALTHY VAGINAL HYGIENE  AVOID   Panytyhose Synthetic underwear (wear COTTON underwear)  Tight pants/jeans Thongs Pantyliners Scented soaps/shower gels (use Dove Sensitive Skin soap or water to clean) Bubble bath/bath bombs Scented detergents  ALL dryer sheets (line dry underwear if using them on your other clothing) Feminine sprays/douches   FOR RECURRENT BACTERIAL VAGINOSIS (BV) Above recommendations and ADD probiotics daily, USE CONDOMS  Visit [email or web address]    

## 2024-04-19 ENCOUNTER — Ambulatory Visit: Payer: Self-pay | Admitting: Obstetrics and Gynecology

## 2024-04-19 LAB — NUSWAB VAGINITIS (VG)
Candida albicans, NAA: NEGATIVE
Candida glabrata, NAA: NEGATIVE
Megasphaera 1: HIGH {score} — AB
Trich vag by NAA: NEGATIVE

## 2024-04-23 ENCOUNTER — Ambulatory Visit (INDEPENDENT_AMBULATORY_CARE_PROVIDER_SITE_OTHER): Payer: MEDICAID

## 2024-04-23 ENCOUNTER — Other Ambulatory Visit: Payer: Self-pay

## 2024-04-23 VITALS — BP 125/80 | HR 103 | Resp 16 | Ht 60.5 in | Wt 128.6 lb

## 2024-04-23 DIAGNOSIS — R102 Pelvic and perineal pain: Secondary | ICD-10-CM | POA: Diagnosis not present

## 2024-04-23 DIAGNOSIS — B379 Candidiasis, unspecified: Secondary | ICD-10-CM

## 2024-04-23 DIAGNOSIS — N898 Other specified noninflammatory disorders of vagina: Secondary | ICD-10-CM

## 2024-04-23 DIAGNOSIS — Z2239 Carrier of other specified bacterial diseases: Secondary | ICD-10-CM

## 2024-04-23 LAB — POCT URINALYSIS DIPSTICK
Bilirubin, UA: NEGATIVE
Blood, UA: NEGATIVE
Glucose, UA: NEGATIVE
Ketones, UA: POSITIVE
Leukocytes, UA: NEGATIVE
Nitrite, UA: NEGATIVE
Protein, UA: POSITIVE — AB
Spec Grav, UA: 1.025 (ref 1.010–1.025)
Urobilinogen, UA: 0.2 U/dL
pH, UA: 6 (ref 5.0–8.0)

## 2024-04-23 MED ORDER — FLUCONAZOLE 150 MG PO TABS: 150.0000 mg | ORAL_TABLET | Freq: Once | ORAL | 0 refills | Status: AC

## 2024-04-23 NOTE — Progress Notes (Signed)
    NURSE VISIT NOTE  Subjective:    Patient ID: Karen Choi, female    DOB: 07-21-2006, 18 y.o.   MRN: [id number]  HPI  Patient is a 18 y.o. G77P0010 female who presents for white vaginal discharge with odor for 3 month(s). Patient came in today for an ultrasound, but requested to be tested again for BV or ureaplasma. She was very upset complains of vaginal pain and back pain. She said she has been tested and treated several times and symptoms are not resolved. Denies abnormal vaginal bleeding or significant pelvic pain or fever. admits to pelvic pain and back pain. Patient has history of known exposure to STD.   Objective:    LMP 03/24/2024 (Exact Date)    Lab Results  Component Value Date   COLORU yellow 04/03/2024   CLARITYU clear 04/03/2024   GLUCOSEUR Negative 04/23/2024   BILIRUBINUR Negative 04/23/2024   KETONESU Positive 04/23/2024   SPECGRAV 1.025 04/23/2024   RBCUR Negative 04/23/2024   PHUR 6.0 04/23/2024   PROTEINUR Positive (A) 04/23/2024   UROBILINOGEN 0.2 04/23/2024   LEUKOCYTESUR Negative 04/23/2024    Large Ketones  Assessment:   1. Pelvic pain   2. 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 Vaginal odor   4. Vaginal discharge     bacterial vaginosis, trichomonas, rule out GC or chlamydia, and nonspecific vaginitis and ureaplasma.  Plan:   GC and chlamydia DNA  probe sent to lab. Treatment: abstain from coitus until testing is completed and treatment is completed for both you and partner and TOC is complete and resulted. Use a condom Patient was advised not to rtc for a nurse visit but to see Provider asap.     Camelia Fetters, CMA [facility]

## 2024-04-26 ENCOUNTER — Encounter: Payer: Self-pay | Admitting: Obstetrics and Gynecology

## 2024-04-26 LAB — NUSWAB VAGINITIS PLUS (VG+)
Candida albicans, NAA: NEGATIVE
Candida glabrata, NAA: NEGATIVE
Chlamydia trachomatis, NAA: NEGATIVE
Megasphaera 1: HIGH {score} — AB
Neisseria gonorrhoeae, NAA: NEGATIVE
Trich vag by NAA: NEGATIVE

## 2024-04-29 NOTE — Telephone Encounter (Signed)
 Can you see if ureaplasma can be added with Mia? Thx.

## 2024-04-30 ENCOUNTER — Other Ambulatory Visit: Payer: Self-pay

## 2024-04-30 DIAGNOSIS — Z2239 Carrier of other specified bacterial diseases: Secondary | ICD-10-CM

## 2024-04-30 NOTE — Telephone Encounter (Signed)
 Order added, Mia will check with [facility].

## 2024-04-30 NOTE — Telephone Encounter (Signed)
 Pls let me know if can or can't be added. If not, I'll just retreat pt.

## 2024-05-01 ENCOUNTER — Ambulatory Visit: Payer: MEDICAID

## 2024-05-02 ENCOUNTER — Ambulatory Visit: Payer: Self-pay | Admitting: Obstetrics and Gynecology

## 2024-05-02 LAB — GENITAL MYCOPLASMAS NAA, SWAB
Mycoplasma genitalium NAA: NEGATIVE
Mycoplasma hominis NAA: NEGATIVE
Ureaplasma spp NAA: NEGATIVE

## 2024-05-02 LAB — SPECIMEN STATUS REPORT

## 2024-05-17 ENCOUNTER — Emergency Department (HOSPITAL_COMMUNITY): Payer: MEDICAID

## 2024-05-17 ENCOUNTER — Other Ambulatory Visit: Payer: Self-pay

## 2024-05-17 ENCOUNTER — Encounter (HOSPITAL_COMMUNITY): Payer: Self-pay

## 2024-05-17 ENCOUNTER — Emergency Department (HOSPITAL_COMMUNITY)
Admission: EM | Admit: 2024-05-17 | Discharge: 2024-05-17 | Disposition: A | Discharge status: Home / Self Care | Payer: MEDICAID | Attending: Emergency Medicine | Admitting: Emergency Medicine

## 2024-05-17 DIAGNOSIS — R1013 Epigastric pain: Secondary | ICD-10-CM | POA: Insufficient documentation

## 2024-05-17 DIAGNOSIS — J45909 Unspecified asthma, uncomplicated: Secondary | ICD-10-CM | POA: Diagnosis not present

## 2024-05-17 HISTORY — DX: Other specified bacterial agents as the cause of diseases classified elsewhere: B96.89

## 2024-05-17 LAB — COMPREHENSIVE METABOLIC PANEL WITH GFR
ALT: 15 U/L (ref 0–44)
AST: 20 U/L (ref 15–41)
Albumin: 4.2 g/dL (ref 3.5–5.0)
Alkaline Phosphatase: 63 U/L (ref 38–126)
Anion gap: 10 (ref 5–15)
BUN: 10 mg/dL (ref 6–20)
CO2: 19 mmol/L — ABNORMAL LOW (ref 22–32)
Calcium: 9.6 mg/dL (ref 8.9–10.3)
Chloride: 104 mmol/L (ref 98–111)
Creatinine, Ser: 0.62 mg/dL (ref 0.44–1.00)
GFR, Estimated: >60 mL/min (ref >60)
Glucose, Bld: 89 mg/dL (ref 70–99)
Potassium: 4 mmol/L (ref 3.5–5.1)
Sodium: 133 mmol/L — ABNORMAL LOW (ref 135–145)
Total Bilirubin: 0.8 mg/dL (ref 0.0–1.2)
Total Protein: 7.7 g/dL (ref 6.5–8.1)

## 2024-05-17 LAB — CBC WITH DIFFERENTIAL/PLATELET
Abs Immature Granulocytes: 0.01 K/uL (ref 0.00–0.07)
Basophils Absolute: 0 K/uL (ref 0.0–0.1)
Basophils Relative: 1 %
Eosinophils Absolute: 0.1 K/uL (ref 0.0–0.5)
Eosinophils Relative: 1 %
HCT: 39.3 % (ref 36.0–46.0)
Hemoglobin: 13.1 g/dL (ref 12.0–15.0)
Immature Granulocytes: 0 %
Lymphocytes Relative: 33 %
Lymphs Abs: 2.6 K/uL (ref 0.7–4.0)
MCH: 28.7 pg (ref 26.0–34.0)
MCHC: 33.3 g/dL (ref 30.0–36.0)
MCV: 86 fL (ref 80.0–100.0)
Monocytes Absolute: 0.6 K/uL (ref 0.1–1.0)
Monocytes Relative: 7 %
Neutro Abs: 4.6 K/uL (ref 1.7–7.7)
Neutrophils Relative %: 58 %
Platelets: 247 K/uL (ref 150–400)
RBC: 4.57 MIL/uL (ref 3.87–5.11)
RDW: 13 % (ref 11.5–15.5)
WBC: 7.9 K/uL (ref 4.0–10.5)
nRBC: 0 % (ref 0.0–0.2)

## 2024-05-17 LAB — PREGNANCY, URINE: Preg Test, Ur: NEGATIVE

## 2024-05-17 LAB — URINALYSIS, ROUTINE W REFLEX MICROSCOPIC
Bilirubin Urine: NEGATIVE
Glucose, UA: NEGATIVE mg/dL
Hgb urine dipstick: NEGATIVE
Ketones, ur: NEGATIVE mg/dL
Leukocytes,Ua: NEGATIVE
Nitrite: NEGATIVE
Protein, ur: NEGATIVE mg/dL
Specific Gravity, Urine: 1.006 (ref 1.005–1.030)
pH: 6 (ref 5.0–8.0)

## 2024-05-17 LAB — D-DIMER, QUANTITATIVE: D-Dimer, Quant: 0.31 ug{FEU}/mL (ref 0.00–0.50)

## 2024-05-17 LAB — LIPASE, BLOOD: Lipase: 37 U/L (ref 11–51)

## 2024-05-17 MED ORDER — FAMOTIDINE 20 MG PO TABS: 20.0000 mg | ORAL_TABLET | Freq: Two times a day (BID) | ORAL | 0 refills | Status: DC

## 2024-05-17 MED ORDER — FAMOTIDINE 20 MG PO TABS
20.0000 mg | ORAL_TABLET | Freq: Once | ORAL | Status: AC
  Administered 2024-05-17: 20 mg via ORAL
  Filled 2024-05-17: qty 1

## 2024-05-17 MED ORDER — SODIUM CHLORIDE 0.9 % IV BOLUS
1000.0000 mL | Freq: Once | INTRAVENOUS | Status: AC
  Administered 2024-05-17: 1000 mL via INTRAVENOUS

## 2024-05-17 MED ORDER — SODIUM CHLORIDE 0.9 % IV BOLUS
1000.0000 mL | Freq: Once | INTRAVENOUS | Status: AC
Start: 2024-05-17 — End: 2024-05-17
  Administered 2024-05-17: 1000 mL via INTRAVENOUS

## 2024-05-17 NOTE — ED Triage Notes (Signed)
 Pt arrived via POV c/o upper epigastric pain, and chest discomfort when she breathes. Pt reports recent Covid infection apprx 1 month ago as well. Pt denies N/V/D. Pt reports feeling SOB.

## 2024-05-17 NOTE — ED Provider Notes (Signed)
 [place] EMERGENCY DEPARTMENT AT [facility] Provider Note   CSN: [id number] Arrival date & time: 05/17/24  1127     Patient presents with: Abdominal Pain   Karen Choi is a 18 y.o. female with a history significant for asthma presenting with complaints of epigastric and chest pain in association with shortness of breath which is worsened with deep inspiration and has been present for the past week.  She was diagnosed with COVID approximately 1 month ago but felt improved from this infection until this past week.  She denies history of acid reflux or nausea vomiting, denies lower abdominal pain.  There is no radiation of pain into her back, denies fevers or chills, cough, palpitations.  She denies history of hypercoagulability, denies peripheral edema or unilateral leg swelling.  She has had a couple of episodes of wheezing for which she has used her albuterol  inhaler with transient improvement.      The history is provided by the patient.       Prior to Admission medications   Medication Sig Start Date End Date Taking? Authorizing Provider  albuterol  (VENTOLIN  HFA) 108 (90 Base) MCG/ACT inhaler Inhale 2 puffs into the lungs every 6 (six) hours as needed for shortness of breath or wheezing. 04/17/24 04/17/25 Yes [provider]  azithromycin  (ZITHROMAX ) 250 MG tablet Take 250 mg by mouth. 04/17/24  Yes [provider]  brompheniramine-pseudoephedrine-DM 30-2-10 MG/5ML syrup Take 5 mLs by mouth 4 (four) times daily as needed. 04/17/24  Yes [provider]  famotidine  (PEPCID ) 20 MG tablet Take 1 tablet (20 mg total) by mouth 2 (two) times daily. 05/17/24  Yes Dallen Bunte, PA-C  fluconazole  (DIFLUCAN ) 150 MG tablet Take 150 mg by mouth once. 05/16/24  Yes [provider]  ondansetron (ZOFRAN-ODT) 4 MG disintegrating tablet Take 4 mg by mouth every 8 (eight) hours as needed. 01/05/24  Yes [provider]  predniSONE (DELTASONE) 20 MG  tablet Take 20 mg by mouth daily. 04/17/24  Yes [provider]  cetirizine (ZYRTEC) 10 MG tablet TAKE 1 TABLET BY MOUTH ONCE DAILY FOR ALLERGIES 12/20/22   [provider]  metroNIDAZOLE  (FLAGYL ) 500 MG tablet Take 1 tab BID for 7 days; NO alcohol use for 10 days after prescription start 04/17/24   Copland, Alicia B, PA-C    Allergies: Patient has no known allergies.    Review of Systems  Constitutional:  Negative for chills and fever.  HENT:  Negative for congestion and sore throat.   Eyes: Negative.   Respiratory:  Positive for chest tightness, shortness of breath and wheezing.   Cardiovascular:  Negative for chest pain.  Gastrointestinal:  Positive for abdominal pain. Negative for diarrhea, nausea and vomiting.  Genitourinary: Negative.   Musculoskeletal:  Negative for arthralgias, joint swelling and neck pain.  Skin: Negative.  Negative for rash and wound.  Neurological:  Negative for dizziness, weakness, light-headedness, numbness and headaches.  Psychiatric/Behavioral: Negative.      Updated Vital Signs BP 108/65   Pulse 97   Temp 97.9 F (36.6 C) (Oral)   Resp 17   Ht 5' 0.5 (1.537 m)   Wt 58.1 kg   LMP 04/19/2024 (Exact Date)   SpO2 98%   BMI 24.59 kg/m   Physical Exam Vitals and nursing note reviewed.  Constitutional:      Appearance: She is well-developed.  HENT:     Head: Normocephalic and atraumatic.  Eyes:     Conjunctiva/sclera: Conjunctivae normal.  Cardiovascular:     Rate and Rhythm: Normal rate and regular rhythm.     Heart sounds: Normal heart sounds.  Pulmonary:     Effort: Pulmonary effort is normal.     Breath sounds: Normal breath sounds. No wheezing.  Abdominal:     General: Abdomen is flat. Bowel sounds are normal. There is no distension.     Palpations: Abdomen is soft.     Tenderness: There is abdominal tenderness in the epigastric area. There is no guarding or rebound.  Musculoskeletal:        General: Normal range of  motion.     Cervical back: Normal range of motion.  Skin:    General: Skin is warm and dry.  Neurological:     Mental Status: She is alert.     (all labs ordered are listed, but only abnormal results are displayed) Labs Reviewed  COMPREHENSIVE METABOLIC PANEL WITH GFR - Abnormal; Notable for the following components:      Result Value   Sodium 133 (*)    CO2 19 (*)    All other components within normal limits  URINALYSIS, ROUTINE W REFLEX MICROSCOPIC - Abnormal; Notable for the following components:   Color, Urine STRAW (*)    All other components within normal limits  CBC WITH DIFFERENTIAL/PLATELET  LIPASE, BLOOD  D-DIMER, QUANTITATIVE  PREGNANCY, URINE    EKG: EKG Interpretation Date/Time:  Thursday May 17 2024 11:52:33 EDT Ventricular Rate:  151 PR Interval:  112 QRS Duration:  82 QT Interval:  265 QTC Calculation: 420 R Axis:   84  Text Interpretation: Sinus tachycardia Ventricular premature complex Confirmed by Garrick Charleston [phone]) on 05/17/2024 12:53:34 PM  Radiology: [facility] Chest Portable 1 View Result Date: 05/17/2024 CLINICAL DATA:  Shortness of breath. EXAM: PORTABLE CHEST 1 VIEW COMPARISON:  Chest radiograph dated 05/17/2024. FINDINGS: The heart size and mediastinal contours are within normal limits. Both lungs are clear. The visualized skeletal structures are unremarkable. IMPRESSION: No active disease. Electronically Signed   By: Vanetta Chou M.D.   On: 05/17/2024 14:01     Procedures   Medications Ordered in the ED  sodium chloride  0.9 % bolus 1,000 mL (0 mLs Intravenous Stopped 05/17/24 1352)  sodium chloride  0.9 % bolus 1,000 mL (0 mLs Intravenous Stopped 05/17/24 1554)  famotidine  (PEPCID ) tablet 20 mg (20 mg Oral Given 05/17/24 1606)                                    Medical Decision Making Patient presenting with 1 week history of epigastric discomfort radiating into her mid chest in association with episodes of wheezing, she is not wheezing  on my exam today however she does endorse using her albuterol  inhaler just prior to arrival.  Initially her pulse rate was elevated and with complaints of pleuritic symptoms concern for PE is raised, also with recent history of COVID, this could represent pneumonia or COVID-pneumonia.  Other possibilities include biliary colic/gallstones, GERD, ACS although unlikely in this otherwise healthy 18 year old.  Labs and imaging were performed today and are reassuring.  She was stable at time of discharge, respiratory rate was 18 heart rate 88, it was noted by this provider that when entering the room her pulse rate was typically less than 100 but would quickly bump to 110-115, suggest there may be an element of whitecoat syndrome.  She has PERC negative with the negative D-dimer.  Amount and/or Complexity of Data Reviewed Labs: ordered.    Details: Labs reviewed including UA, lipase, c-Met D-dimer and CBC with differential, all unremarkable.  S Radiology: ordered.    Details: Chest x-ray negative for pneumonia ECG/medicine tests: ordered.    Details: Initially patient was very tachycardic at 151, her EKG was sinus tachycardic.  Patient had a had albuterol  just prior to arrival which could partially account for this tachycardia.  She was given IV fluids while here and her vital signs were normal range at disposition.  Risk Prescription drug management.        Final diagnoses:  Epigastric pain    ED Discharge Orders          Ordered    famotidine  (PEPCID ) 20 MG tablet  2 times daily        05/17/24 1602               Birdena Clarity, PA-C 05/19/24 1327    Garrick Charleston, MD 05/19/24 1654

## 2024-05-17 NOTE — Discharge Instructions (Addendum)
 Your labs and x-rays today are normal and reassuring.  I am giving you prescription for Pepcid  to see if this will alleviate your upper abdominal discomfort in any way.  I also recommend continuing using your albuterol inhaler if you have return of your wheezing or shortness of breath.  Plan follow-up with your primary doctor if your symptoms are not resolving with this treatment plan.

## 2024-05-19 ENCOUNTER — Emergency Department (HOSPITAL_COMMUNITY)
Admission: EM | Admit: 2024-05-19 | Discharge: 2024-05-20 | Disposition: A | Discharge status: Home / Self Care | Payer: MEDICAID | Attending: Emergency Medicine | Admitting: Emergency Medicine

## 2024-05-19 ENCOUNTER — Encounter (HOSPITAL_COMMUNITY): Payer: Self-pay | Admitting: *Deleted

## 2024-05-19 ENCOUNTER — Other Ambulatory Visit: Payer: Self-pay

## 2024-05-19 DIAGNOSIS — J45909 Unspecified asthma, uncomplicated: Secondary | ICD-10-CM | POA: Insufficient documentation

## 2024-05-19 DIAGNOSIS — R0789 Other chest pain: Secondary | ICD-10-CM | POA: Diagnosis not present

## 2024-05-19 DIAGNOSIS — Z3201 Encounter for pregnancy test, result positive: Secondary | ICD-10-CM

## 2024-05-19 DIAGNOSIS — O99891 Other specified diseases and conditions complicating pregnancy: Secondary | ICD-10-CM | POA: Insufficient documentation

## 2024-05-19 NOTE — ED Triage Notes (Signed)
 Patient reports chest pain. States if it causing her to breathe funny. Patient also states she was here a few days ago. Rates pain 9/10.

## 2024-05-19 NOTE — ED Notes (Signed)
 The pt was seen at [place] for the same 9-4 and she was not happy with what she  was being told

## 2024-05-20 LAB — I-STAT CHEM 8, ED
BUN: 7 mg/dL (ref 6–20)
Calcium, Ion: 1.28 mmol/L (ref 1.15–1.40)
Chloride: 104 mmol/L (ref 98–111)
Creatinine, Ser: 0.7 mg/dL (ref 0.44–1.00)
Glucose, Bld: 103 mg/dL — ABNORMAL HIGH (ref 70–99)
HCT: 39 % (ref 36.0–46.0)
Hemoglobin: 13.3 g/dL (ref 12.0–15.0)
Potassium: 3.8 mmol/L (ref 3.5–5.1)
Sodium: 137 mmol/L (ref 135–145)
TCO2: 20 mmol/L — ABNORMAL LOW (ref 22–32)

## 2024-05-20 LAB — COMPREHENSIVE METABOLIC PANEL WITH GFR
ALT: 12 U/L (ref 0–44)
AST: 18 U/L (ref 15–41)
Albumin: 4 g/dL (ref 3.5–5.0)
Alkaline Phosphatase: 53 U/L (ref 38–126)
Anion gap: 12 (ref 5–15)
BUN: 7 mg/dL (ref 6–20)
CO2: 21 mmol/L — ABNORMAL LOW (ref 22–32)
Calcium: 10.1 mg/dL (ref 8.9–10.3)
Chloride: 102 mmol/L (ref 98–111)
Creatinine, Ser: 0.69 mg/dL (ref 0.44–1.00)
GFR, Estimated: >60 mL/min (ref >60)
Glucose, Bld: 101 mg/dL — ABNORMAL HIGH (ref 70–99)
Potassium: 3.8 mmol/L (ref 3.5–5.1)
Sodium: 135 mmol/L (ref 135–145)
Total Bilirubin: 0.6 mg/dL (ref 0.0–1.2)
Total Protein: 7.1 g/dL (ref 6.5–8.1)

## 2024-05-20 LAB — CBC
HCT: 37.9 % (ref 36.0–46.0)
Hemoglobin: 12.5 g/dL (ref 12.0–15.0)
MCH: 28.1 pg (ref 26.0–34.0)
MCHC: 33 g/dL (ref 30.0–36.0)
MCV: 85.2 fL (ref 80.0–100.0)
Platelets: 232 K/uL (ref 150–400)
RBC: 4.45 MIL/uL (ref 3.87–5.11)
RDW: 12.9 % (ref 11.5–15.5)
WBC: 8.4 K/uL (ref 4.0–10.5)
nRBC: 0 % (ref 0.0–0.2)

## 2024-05-20 LAB — HCG, SERUM, QUALITATIVE: Preg, Serum: POSITIVE — AB

## 2024-05-20 LAB — TROPONIN I (HIGH SENSITIVITY): Troponin I (High Sensitivity): <2 ng/L (ref <18)

## 2024-05-20 LAB — LIPASE, BLOOD: Lipase: 42 U/L (ref 11–51)

## 2024-05-20 LAB — HCG, QUANTITATIVE, PREGNANCY: hCG, Beta Chain, Quant, S: 97 m[IU]/mL — ABNORMAL HIGH (ref <5)

## 2024-05-20 MED ORDER — IPRATROPIUM-ALBUTEROL 0.5-2.5 (3) MG/3ML IN SOLN
3.0000 mL | Freq: Once | RESPIRATORY_TRACT | Status: AC
  Administered 2024-05-20: 3 mL via RESPIRATORY_TRACT
  Filled 2024-05-20: qty 3

## 2024-05-20 MED ORDER — DEXAMETHASONE 4 MG PO TABS
10.0000 mg | ORAL_TABLET | Freq: Once | ORAL | Status: DC
  Filled 2024-05-20: qty 3

## 2024-05-20 NOTE — ED Notes (Signed)
Patient verbalizes understanding of discharge instructions. Opportunity for questioning and answers were provided. Armband removed by staff, pt discharged from ED. Ambulated out to lobby  

## 2024-05-20 NOTE — Discharge Instructions (Signed)
 Your workup in the emergency department was reassuring.  You were found to have a positive pregnancy test today.  Call an OB/GYN to schedule a prenatal visit.  Begin taking prenatal vitamins.  You may continue to the use of famotidine  as previously prescribed.  The use of Tums is also safe in pregnancy.  Take Tylenol as needed for management of pain.  Return for new or concerning symptoms. For pregnancy related concerns requiring immediate attention, present to MAU at [facility].

## 2024-05-20 NOTE — ED Provider Notes (Signed)
 [place] EMERGENCY DEPARTMENT AT [facility] Provider Note   CSN: [id number] Arrival date & time: 05/19/24  2336     Patient presents with: Chest Pain   Karen Choi is a 18 y.o. female.   18 year old asthmatic presents to the emergency department for evaluation of chest pain. Pain is in the central chest, described as a burning and tightening feeling. It is fairly constant, present over the past few days. She has been trying Pepcid  for symptoms without relief. Has been using her albuterol  inhaler with little symptomatic change. No fevers or chills, syncope, vomiting, hemoptysis. Reports hx of COVID infection 1 month ago. Seen at [facility] on 05/17/24 for chest pain with reassuring work up.  The history is provided by the patient. No language interpreter was used.  Chest Pain      Prior to Admission medications   Medication Sig Start Date End Date Taking? Authorizing Provider  albuterol  (VENTOLIN  HFA) 108 (90 Base) MCG/ACT inhaler Inhale 2 puffs into the lungs every 6 (six) hours as needed for shortness of breath or wheezing. 04/17/24 04/17/25  [provider]  azithromycin  (ZITHROMAX ) 250 MG tablet Take 250 mg by mouth. 04/17/24   [provider]  brompheniramine-pseudoephedrine-DM 30-2-10 MG/5ML syrup Take 5 mLs by mouth 4 (four) times daily as needed. 04/17/24   [provider]  cetirizine (ZYRTEC) 10 MG tablet TAKE 1 TABLET BY MOUTH ONCE DAILY FOR ALLERGIES 12/20/22   [provider]  famotidine  (PEPCID ) 20 MG tablet Take 1 tablet (20 mg total) by mouth 2 (two) times daily. 05/17/24   Idol, Julie, PA-C  fluconazole  (DIFLUCAN ) 150 MG tablet Take 150 mg by mouth once. 05/16/24   [provider]  metroNIDAZOLE  (FLAGYL ) 500 MG tablet Take 1 tab BID for 7 days; NO alcohol use for 10 days after prescription start 04/17/24   Copland, Alicia B, PA-C  ondansetron (ZOFRAN-ODT) 4 MG disintegrating tablet Take 4 mg by mouth every 8  (eight) hours as needed. 01/05/24   [provider]  predniSONE (DELTASONE) 20 MG tablet Take 20 mg by mouth daily. 04/17/24   [provider]    Allergies: Patient has no known allergies.    Review of Systems  Cardiovascular:  Positive for chest pain.  Ten systems reviewed and are negative for acute change, except as noted in the HPI.    Updated Vital Signs BP 120/84   Pulse 90   Temp 97.9 F (36.6 C)   Resp 12   Ht 5' 3 (1.6 m)   LMP 04/19/2024 (Exact Date)   SpO2 100%   BMI 22.67 kg/m   Physical Exam Vitals and nursing note reviewed.  Constitutional:      General: She is not in acute distress.    Appearance: She is well-developed. She is not diaphoretic.     Comments: Nontoxic appearing and in NAD  HENT:     Head: Normocephalic and atraumatic.  Eyes:     General: No scleral icterus.    Conjunctiva/sclera: Conjunctivae normal.  Cardiovascular:     Rate and Rhythm: Normal rate and regular rhythm.     Pulses: Normal pulses.  Pulmonary:     Effort: Pulmonary effort is normal. No respiratory distress.     Breath sounds: No stridor. No rhonchi.     Comments: Mild tachypnea without overt dyspnea or distress. Lungs grossly CTAB. Musculoskeletal:        General: Normal range of motion.     Cervical  back: Normal range of motion.  Skin:    General: Skin is warm and dry.     Coloration: Skin is not pale.     Findings: No erythema or rash.  Neurological:     Mental Status: She is alert and oriented to person, place, and time.     Coordination: Coordination normal.  Psychiatric:        Behavior: Behavior normal.     (all labs ordered are listed, but only abnormal results are displayed) Labs Reviewed  COMPREHENSIVE METABOLIC PANEL WITH GFR - Abnormal; Notable for the following components:      Result Value   CO2 21 (*)    Glucose, Bld 101 (*)    All other components within normal limits  HCG, SERUM, QUALITATIVE - Abnormal; Notable for the following  components:   Preg, Serum POSITIVE (*)    All other components within normal limits  HCG, QUANTITATIVE, PREGNANCY - Abnormal; Notable for the following components:   hCG, Beta Chain, Quant, S 97 (*)    All other components within normal limits  I-STAT CHEM 8, ED - Abnormal; Notable for the following components:   Glucose, Bld 103 (*)    TCO2 20 (*)    All other components within normal limits  LIPASE, BLOOD  CBC  TROPONIN I (HIGH SENSITIVITY)    EKG: None  Radiology: No results found.   Procedures   Medications Ordered in the ED  dexamethasone  (DECADRON ) tablet 10 mg (10 mg Oral Not Given 05/20/24 0156)  ipratropium-albuterol  (DUONEB) 0.5-2.5 (3) MG/3ML nebulizer solution 3 mL (3 mLs Nebulization Given 05/20/24 0038)                                    Medical Decision Making Amount and/or Complexity of Data Reviewed Labs: ordered.  Risk Prescription drug management.   This patient presents to the ED for concern of chest pain, this involves an extensive number of treatment options, and is a complaint that carries with it a high risk of complications and morbidity.  The differential diagnosis includes PUD/gastritis vs post viral myocarditis vs post viral bronchitis vs pericarditis vs MSK vs anxiety   Co morbidities that complicate the patient evaluation  Asthma   Additional history obtained:  External records from outside source obtained and reviewed including dimer from 05/17/24 which was negative. CXR from 05/17/24 also negative for acute cardiopulmonary abnormality.   Lab Tests:  I Ordered, and personally interpreted labs.  The pertinent results include:  Upreg positive, hCG 97. Otherwise labs noncontributory.    Cardiac Monitoring:  The patient was maintained on a cardiac monitor.  I personally viewed and interpreted the cardiac monitored which showed an underlying rhythm of: NSR   Medicines ordered and prescription drug management:  I ordered medication  including Duoneb for chest pain  Reevaluation of the patient after these medicines showed that the patient improved I have reviewed the patients home medicines and have made adjustments as needed   Test Considered:  Repeat CXR - felt low yield   Problem List / ED Course:  Reviewed D dimer from 3 days ago which was negative. Will add troponin to labs ordered in triage to assess for possible myocarditis; however, low suspicion. Lung sounds reassuring over, patient moving air well. Will see if Duoneb provides any symptomatic relief. Symptoms could certainly be related to a post viral bronchitis. Laboratory workup stable compared to prior.  Patient has had some symptomatic improvement following a DuoNeb.  Lungs are clear on repeat auscultation. Patient was found to have a positive pregnancy test today.  hCG quant is 97.  She had a negative pregnancy test 3 days ago.  No abdominal pain, vaginal bleeding.  Discussed the need for patient to follow-up with her OB/GYN for prenatal care.  Counseled that it is OK for her to continue Pepcid  and Tums while pregnant.   Reevaluation:  After the interventions noted above, I reevaluated the patient and found that they have :improved   Social Determinants of Health:  Lives independently    Dispostion:  After consideration of the diagnostic results and the patients response to treatment, I feel that the patent would benefit from symptomatic care and PCP f/u. Return precautions discussed and provided. Patient discharged in stable condition with no unaddressed concerns.       Final diagnoses:  Atypical chest pain  Positive pregnancy test    ED Discharge Orders     None          Keith Sor, PA-C 05/20/24 9394    Lorette Mayo, MD 05/20/24 [phone]

## 2024-05-27 NOTE — Telephone Encounter (Signed)
 Pt is OB pt now. Please call her Mon and triage her as needed. Dempsey Knotek

## 2024-05-28 ENCOUNTER — Other Ambulatory Visit: Payer: Self-pay

## 2024-05-28 ENCOUNTER — Inpatient Hospital Stay (HOSPITAL_COMMUNITY)
Admission: AD | Admit: 2024-05-28 | Discharge: 2024-05-28 | Disposition: A | Discharge status: Home / Self Care | Payer: MEDICAID | Attending: Obstetrics & Gynecology | Admitting: Obstetrics & Gynecology

## 2024-05-28 ENCOUNTER — Encounter (HOSPITAL_COMMUNITY): Payer: Self-pay | Admitting: *Deleted

## 2024-05-28 ENCOUNTER — Inpatient Hospital Stay (HOSPITAL_COMMUNITY): Payer: MEDICAID

## 2024-05-28 DIAGNOSIS — O3680X0 Pregnancy with inconclusive fetal viability, not applicable or unspecified: Secondary | ICD-10-CM

## 2024-05-28 DIAGNOSIS — Z3A01 Less than 8 weeks gestation of pregnancy: Secondary | ICD-10-CM

## 2024-05-28 DIAGNOSIS — B9689 Other specified bacterial agents as the cause of diseases classified elsewhere: Secondary | ICD-10-CM | POA: Diagnosis not present

## 2024-05-28 DIAGNOSIS — Z113 Encounter for screening for infections with a predominantly sexual mode of transmission: Secondary | ICD-10-CM | POA: Diagnosis present

## 2024-05-28 DIAGNOSIS — O23591 Infection of other part of genital tract in pregnancy, first trimester: Secondary | ICD-10-CM | POA: Diagnosis not present

## 2024-05-28 DIAGNOSIS — O26891 Other specified pregnancy related conditions, first trimester: Secondary | ICD-10-CM | POA: Diagnosis not present

## 2024-05-28 DIAGNOSIS — N760 Acute vaginitis: Secondary | ICD-10-CM | POA: Diagnosis not present

## 2024-05-28 DIAGNOSIS — Z3490 Encounter for supervision of normal pregnancy, unspecified, unspecified trimester: Secondary | ICD-10-CM

## 2024-05-28 DIAGNOSIS — R109 Unspecified abdominal pain: Secondary | ICD-10-CM | POA: Diagnosis present

## 2024-05-28 LAB — CBC
HCT: 38.3 % (ref 36.0–46.0)
Hemoglobin: 12.7 g/dL (ref 12.0–15.0)
MCH: 28.1 pg (ref 26.0–34.0)
MCHC: 33.2 g/dL (ref 30.0–36.0)
MCV: 84.7 fL (ref 80.0–100.0)
Platelets: 282 K/uL (ref 150–400)
RBC: 4.52 MIL/uL (ref 3.87–5.11)
RDW: 12.7 % (ref 11.5–15.5)
WBC: 6.5 K/uL (ref 4.0–10.5)
nRBC: 0 % (ref 0.0–0.2)

## 2024-05-28 LAB — URINALYSIS, ROUTINE W REFLEX MICROSCOPIC
Bilirubin Urine: NEGATIVE
Glucose, UA: NEGATIVE mg/dL
Hgb urine dipstick: NEGATIVE
Ketones, ur: 5 mg/dL — AB
Leukocytes,Ua: NEGATIVE
Nitrite: NEGATIVE
Protein, ur: NEGATIVE mg/dL
Specific Gravity, Urine: 1.003 — ABNORMAL LOW (ref 1.005–1.030)
pH: 6 (ref 5.0–8.0)

## 2024-05-28 LAB — COMPREHENSIVE METABOLIC PANEL WITH GFR
ALT: 12 U/L (ref 0–44)
AST: 17 U/L (ref 15–41)
Albumin: 3.9 g/dL (ref 3.5–5.0)
Alkaline Phosphatase: 51 U/L (ref 38–126)
Anion gap: 10 (ref 5–15)
BUN: 7 mg/dL (ref 6–20)
CO2: 19 mmol/L — ABNORMAL LOW (ref 22–32)
Calcium: 9.4 mg/dL (ref 8.9–10.3)
Chloride: 105 mmol/L (ref 98–111)
Creatinine, Ser: 0.6 mg/dL (ref 0.44–1.00)
GFR, Estimated: >60 mL/min (ref >60)
Glucose, Bld: 87 mg/dL (ref 70–99)
Potassium: 3.9 mmol/L (ref 3.5–5.1)
Sodium: 134 mmol/L — ABNORMAL LOW (ref 135–145)
Total Bilirubin: 0.3 mg/dL (ref 0.0–1.2)
Total Protein: 7 g/dL (ref 6.5–8.1)

## 2024-05-28 LAB — WET PREP, GENITAL
Sperm: NONE SEEN
Trich, Wet Prep: NONE SEEN
WBC, Wet Prep HPF POC: <10 (ref <10)
Yeast Wet Prep HPF POC: NONE SEEN

## 2024-05-28 LAB — HCG, QUANTITATIVE, PREGNANCY: hCG, Beta Chain, Quant, S: 10492 m[IU]/mL — ABNORMAL HIGH (ref <5)

## 2024-05-28 LAB — ABO/RH: ABO/RH(D): O POS

## 2024-05-28 MED ORDER — METRONIDAZOLE 0.75 % VA GEL: 1.0000 | Freq: Every day | VAGINAL | 1 refills | Status: DC

## 2024-05-28 NOTE — Discharge Instructions (Signed)

## 2024-05-28 NOTE — MAU Note (Incomplete)
 Karen Choi is a 18 y.o. at Unknown here in MAU reporting: pain in lower abd for the last month. Positive preg test on 09/07 in the ER. Was in the ER for asthma related symptoms and was not seen for the abd pain. Denies vaginal bleeding. Reports white vaginal discharge.    Onset of complaint: one month ago Pain score: 7/10 lower abd intermitten There were no vitals filed for this visit.   FHT: na  Lab orders placed from triage: ***

## 2024-05-28 NOTE — MAU Provider Note (Signed)
 None     [name] is a 18 y.o. G2P0010 pregnant female at [redacted]w[redacted]d who presents to MAU today with complaint of cramping, she reports the cramping as ongoing. She has been experiencing the cramping for a long time now.    Has not yet established PNC due to gestational age.   Pertinent items noted in HPI and remainder of comprehensive ROS otherwise negative.   O BP 122/75 (BP Location: Right Arm)   Pulse 97   Temp 98.3 F (36.8 C)   Resp 15   Ht 5' 0.5 (1.537 m)   Wt 58.5 kg   LMP 04/19/2024 (Exact Date)   SpO2 99%   BMI 24.78 kg/m  Physical Exam Vitals reviewed.  Constitutional:      General: She is not in acute distress.    Appearance: Normal appearance. She is well-developed. She is not ill-appearing, toxic-appearing or diaphoretic.  HENT:     Head: Normocephalic.  Cardiovascular:     Rate and Rhythm: Normal rate and regular rhythm.     Pulses: Normal pulses.     Heart sounds: Normal heart sounds.  Pulmonary:     Effort: Pulmonary effort is normal.     Breath sounds: Normal breath sounds.  Abdominal:     General: Bowel sounds are normal. There is no distension.     Palpations: Abdomen is soft.     Tenderness: There is no abdominal tenderness.  Skin:    General: Skin is warm and dry.     Capillary Refill: Capillary refill takes less than 2 seconds.  Neurological:     General: No focal deficit present.     Mental Status: She is alert and oriented to person, place, and time.  Psychiatric:        Mood and Affect: Mood normal.        Behavior: Behavior normal.      MDM: PERU workup to R/O PUL Wet prep positive for BV.  US  demonstrated IUP, however high in uterine fundus would recommend f/u US  in 10-14 days out of an abundance of caution.   MAU Course:  A Bacterial vaginosis - Plan: Discharge patient  Intrauterine pregnancy  Medical screening exam complete  P Discharge from MAU in stable condition with routine precautions Follow up  at preferred [facility] as scheduled for ongoing prenatal care. List provided. Plans [facility] for OB care.  Safe medications in pregnancy list provided.  Metronidazole  0.75% 70g PV sent for use for 5 nights.  Advised f/u US  in 10--14 days advised to start [facility], but may return here if unable to get scheduled.  Allergies as of 05/28/2024   No Known Allergies      Medication List     STOP taking these medications    brompheniramine-pseudoephedrine-DM 30-2-10 MG/5ML syrup   metroNIDAZOLE  500 MG tablet Commonly known as: FLAGYL    predniSONE 20 MG tablet Commonly known as: DELTASONE       TAKE these medications    albuterol  108 (90 Base) MCG/ACT inhaler Commonly known as: VENTOLIN  HFA Inhale 2 puffs into the lungs every 6 (six) hours as needed for shortness of breath or wheezing.   azithromycin  250 MG tablet Commonly known as: ZITHROMAX  Take 250 mg by mouth.   cetirizine 10 MG tablet Commonly known as: ZYRTEC TAKE 1 TABLET BY MOUTH ONCE DAILY FOR ALLERGIES   famotidine  20 MG tablet Commonly known as: PEPCID  Take 1 tablet (20 mg total) by mouth 2 (  two) times daily.   fluconazole  150 MG tablet Commonly known as: DIFLUCAN  Take 150 mg by mouth once.   metroNIDAZOLE  0.75 % vaginal gel Commonly known as: METROGEL  Place 1 Applicatorful vaginally at bedtime. Apply one applicatorful to vagina at bedtime for 5 days   ondansetron 4 MG disintegrating tablet Commonly known as: ZOFRAN-ODT Take 4 mg by mouth every 8 (eight) hours as needed.        Camie Rote, MSN, CNM 05/28/2024 3:17 PM  Certified Nurse Midwife, [facility]

## 2024-05-29 LAB — GC/CHLAMYDIA PROBE AMP (~~LOC~~) NOT AT ARMC
Chlamydia: NEGATIVE
Comment: NEGATIVE
Comment: NORMAL
Neisseria Gonorrhea: NEGATIVE

## 2024-06-05 ENCOUNTER — Emergency Department
Admission: EM | Admit: 2024-06-05 | Discharge: 2024-06-05 | Disposition: A | Discharge status: Home / Self Care | Payer: MEDICAID | Attending: Emergency Medicine | Admitting: Emergency Medicine

## 2024-06-05 ENCOUNTER — Emergency Department: Payer: MEDICAID

## 2024-06-05 ENCOUNTER — Other Ambulatory Visit: Payer: Self-pay

## 2024-06-05 DIAGNOSIS — N898 Other specified noninflammatory disorders of vagina: Secondary | ICD-10-CM | POA: Diagnosis not present

## 2024-06-05 DIAGNOSIS — R1032 Left lower quadrant pain: Secondary | ICD-10-CM | POA: Diagnosis not present

## 2024-06-05 DIAGNOSIS — Z3A01 Less than 8 weeks gestation of pregnancy: Secondary | ICD-10-CM | POA: Diagnosis not present

## 2024-06-05 DIAGNOSIS — O99281 Endocrine, nutritional and metabolic diseases complicating pregnancy, first trimester: Secondary | ICD-10-CM | POA: Diagnosis not present

## 2024-06-05 DIAGNOSIS — O26891 Other specified pregnancy related conditions, first trimester: Secondary | ICD-10-CM | POA: Diagnosis present

## 2024-06-05 DIAGNOSIS — E871 Hypo-osmolality and hyponatremia: Secondary | ICD-10-CM | POA: Diagnosis not present

## 2024-06-05 LAB — WET PREP, GENITAL
Clue Cells Wet Prep HPF POC: NONE SEEN
Sperm: NONE SEEN
Trich, Wet Prep: NONE SEEN
WBC, Wet Prep HPF POC: <10 (ref <10)
Yeast Wet Prep HPF POC: NONE SEEN

## 2024-06-05 LAB — URINALYSIS, ROUTINE W REFLEX MICROSCOPIC
Bilirubin Urine: NEGATIVE
Glucose, UA: NEGATIVE mg/dL
Hgb urine dipstick: NEGATIVE
Ketones, ur: NEGATIVE mg/dL
Leukocytes,Ua: NEGATIVE
Nitrite: NEGATIVE
Protein, ur: NEGATIVE mg/dL
Specific Gravity, Urine: 1.004 — ABNORMAL LOW (ref 1.005–1.030)
pH: 7 (ref 5.0–8.0)

## 2024-06-05 LAB — CBC
HCT: 38 % (ref 36.0–46.0)
Hemoglobin: 12.4 g/dL (ref 12.0–15.0)
MCH: 27.4 pg (ref 26.0–34.0)
MCHC: 32.6 g/dL (ref 30.0–36.0)
MCV: 83.9 fL (ref 80.0–100.0)
Platelets: 299 K/uL (ref 150–400)
RBC: 4.53 MIL/uL (ref 3.87–5.11)
RDW: 12.9 % (ref 11.5–15.5)
WBC: 6.4 K/uL (ref 4.0–10.5)
nRBC: 0 % (ref 0.0–0.2)

## 2024-06-05 LAB — BASIC METABOLIC PANEL WITH GFR
Anion gap: 10 (ref 5–15)
BUN: 8 mg/dL (ref 6–20)
CO2: 19 mmol/L — ABNORMAL LOW (ref 22–32)
Calcium: 9.4 mg/dL (ref 8.9–10.3)
Chloride: 104 mmol/L (ref 98–111)
Creatinine, Ser: 0.57 mg/dL (ref 0.44–1.00)
GFR, Estimated: >60 mL/min (ref >60)
Glucose, Bld: 91 mg/dL (ref 70–99)
Potassium: 3.9 mmol/L (ref 3.5–5.1)
Sodium: 133 mmol/L — ABNORMAL LOW (ref 135–145)

## 2024-06-05 LAB — HCG, QUANTITATIVE, PREGNANCY: hCG, Beta Chain, Quant, S: 67507 m[IU]/mL — ABNORMAL HIGH (ref <5)

## 2024-06-05 MED ORDER — ALBUTEROL SULFATE HFA 108 (90 BASE) MCG/ACT IN AERS: 2.0000 | INHALATION_SPRAY | Freq: Four times a day (QID) | RESPIRATORY_TRACT | 2 refills | Status: AC | PRN

## 2024-06-05 NOTE — ED Notes (Signed)
 See triage note  Presents with abd pain  Pain to left lower quadrant   Denies any vaginal bleeding

## 2024-06-05 NOTE — Discharge Instructions (Addendum)
 You tested negative for BV and yeast infection today. Your blood work was normal. Your urinalysis was normal. The ultrasound showed a single baby inside the uterus, measuring 6 weeks and one day old with a heart rate of 112 beats per minute. This is normal. I believe your pain is due to constipation. Please make sure you get lots of fiber in your diet which is found in fruits and veggies. You can also take an over the counter medication for constipation called metamucil. Follow the dosing on the package.   Please follow up with your OBGYN or return to the ED with any worsening symptoms.

## 2024-06-05 NOTE — ED Triage Notes (Signed)
 Pt to ED for LLQ pain started last night. Had US  on 9/15 and was recommeded for a pelvic MRI  in one week d/t, [redacted] weeks pregnant. Pt also reports is constipated.

## 2024-06-05 NOTE — ED Provider Notes (Signed)
[facility] Provider Note    None    (approximate)   History   Abdominal Pain   HPI  Karen Choi is a 18 y.o. female G2P0010 presents for evaluation of abdominal pain. Patient states she has had some throughout her pregnancy, it is in the LLQ. Pain started again last night. Also endorses some abnormal vaginal discharge and vaginal itching. No bleeding, N/V/D. Does endorse constipation.       Physical Exam   Triage Vital Signs: ED Triage Vitals  Encounter Vitals Group     BP 06/05/24 1247 (!) 141/88     Girls Systolic BP Percentile --      Girls Diastolic BP Percentile --      Boys Systolic BP Percentile --      Boys Diastolic BP Percentile --      Pulse Rate 06/05/24 1247 84     Resp 06/05/24 1247 16     Temp 06/05/24 1247 98.2 F (36.8 C)     Temp src --      SpO2 06/05/24 1247 100 %     Weight 06/05/24 1248 127 lb 13.9 oz (58 kg)     Height 06/05/24 1248 5' (1.524 m)     Head Circumference --      Peak Flow --      Pain Score 06/05/24 1248 6     Pain Loc --      Pain Education --      Exclude from Growth Chart --     Most recent vital signs: Vitals:   06/05/24 1722 06/05/24 1724  BP:  117/63  Pulse:  97  Resp:  18  Temp:  98.3 F (36.8 C)  SpO2: 100% 100%   General: Awake, no distress.  CV:  Good peripheral perfusion. RRR. Resp:  Normal effort. CTAB. Abd:  No distention. Soft, TTP in LLQ. Other:     ED Results / Procedures / Treatments   Labs (all labs ordered are listed, but only abnormal results are displayed) Labs Reviewed  BASIC METABOLIC PANEL WITH GFR - Abnormal; Notable for the following components:      Result Value   Sodium 133 (*)    CO2 19 (*)    All other components within normal limits  HCG, QUANTITATIVE, PREGNANCY - Abnormal; Notable for the following components:   hCG, Beta Chain, Quant, S 67,507 (*)    All other components within normal limits  URINALYSIS, ROUTINE W REFLEX MICROSCOPIC  - Abnormal; Notable for the following components:   Color, Urine STRAW (*)    APPearance CLEAR (*)    Specific Gravity, Urine 1.004 (*)    All other components within normal limits  WET PREP, GENITAL  CBC    RADIOLOGY  Pelvic ultrasound obtained, I interpreted the images as well as reviewed the radiologist report.  Images show single IUP gestational age [redacted] weeks 1 day, fetal heart rate 112 bpm.  Further impression below.  IMPRESSION:  1. Single, live intrauterine gestation with an estimated gestational  age of [redacted] weeks, 1 day. Fetal heart rate of 112 beats per minute.  Continued routine obstetric and sonographic follow-up is  recommended.  2. Small hypoechogenic collection along the gestational sac,  measuring 1.6 x 0.7 x 2.5 cm, possibly representing changes from  implantation versus a small subchronic hematoma.  3. Anechoic cystic structure in the left adnexa, likely a  developing, exophytic corpus luteum.    PROCEDURES:  Critical Care performed:  No  Procedures   MEDICATIONS ORDERED IN ED: Medications - No data to display   IMPRESSION / MDM / ASSESSMENT AND PLAN / ED COURSE  I reviewed the triage vital signs and the nursing notes.                             18 year old female who is about [redacted] weeks pregnant presents for evaluation of abdominal pain. Bp is elevated, VSS otherwise. Patient NAD on exam.   Differential diagnosis includes, but is not limited to, ectopic pregnancy, threatened miscarriage, constipation, IBS, diverticulitis.  Patient's presentation is most consistent with acute complicated illness / injury requiring diagnostic workup.  BP is elevated, given patient is only [redacted] weeks pregnant does not meet criteria for pre-eclampsia. Will advise patient to discuss this with her OB when she goes to establish prenatal care.   Patient endorses abnormal vaginal discharge so will obtain Wet prep. No concerns for STDs. Patient recently completed treatment for bacterial  vaginosis and is not sure it has fully gone away.  I reviewed patient's chart and note from [facility] on 9/15. Patient had an ultrasound at that time to evaluate her abdominal pain which showed IUP but high in the uterine fundus. They recommended f/u US  in 10-14 days so we will repeat this imaging today.   Clinical Course as of 06/05/24 1737  Tue Jun 05, 2024  1605 Wet prep, genital No clue cells or yeast. Negative. [LD]  1606 Urinalysis, Routine w reflex microscopic -Urine, Clean Catch(!) Unremarkable.  No signs of infection. [LD]  1606 CBC Within normal limits. [LD]  1606 Basic metabolic panel(!) Mild hyponatremia and low CO2, otherwise unremarkable. [LD]  1607 hCG, quantitative, pregnancy(!) Appropriate for gestational age [LD]  41 US  OB Comp < 14 Wks IMPRESSION: 1. Single, live intrauterine gestation with an estimated gestational age of [redacted] weeks, 1 day. Fetal heart rate of 112 beats per minute. Continued routine obstetric and sonographic follow-up is recommended. 2. Small hypoechogenic collection along the gestational sac, measuring 1.6 x 0.7 x 2.5 cm, possibly representing changes from implantation versus a small subchronic hematoma. 3. Anechoic cystic structure in the left adnexa, likely a developing, exophytic corpus luteum.   [LD]  1735 Results reviewed with the patient.  Discussed that her pain may be coming from a cyst on the left ovary but may also be from constipation.  Recommended over-the-counter constipation medication including Metamucil.  Reviewed return precautions.  Advised patient to follow-up with her OB/GYN and return to the emergency department with any worsening symptoms.  Patient requested a refill of her albuterol .  Which I will send.  She voiced understanding, all questions were answered and she is stable at discharge. [LD]    Clinical Course User Index [LD] Cleaster Tinnie LABOR, PA-C     FINAL CLINICAL IMPRESSION(S) / ED DIAGNOSES   Final diagnoses:   Abdominal pain during pregnancy in first trimester     Rx / DC Orders   ED Discharge Orders          Ordered    albuterol  (VENTOLIN  HFA) 108 (90 Base) MCG/ACT inhaler  Every 6 hours PRN        06/05/24 1734             Note:  This document was prepared using Dragon voice recognition software and may include unintentional dictation errors.   Cleaster Tinnie LABOR, PA-C 06/05/24 1737    Dorothyann Drivers, MD  06/05/24 2111  

## 2024-06-11 ENCOUNTER — Ambulatory Visit: Payer: MEDICAID

## 2024-06-11 DIAGNOSIS — Z3A01 Less than 8 weeks gestation of pregnancy: Secondary | ICD-10-CM | POA: Diagnosis not present

## 2024-06-11 DIAGNOSIS — O3680X0 Pregnancy with inconclusive fetal viability, not applicable or unspecified: Secondary | ICD-10-CM

## 2024-06-15 ENCOUNTER — Ambulatory Visit: Payer: MEDICAID

## 2024-06-17 ENCOUNTER — Other Ambulatory Visit: Payer: Self-pay

## 2024-06-17 ENCOUNTER — Emergency Department
Admission: EM | Admit: 2024-06-17 | Discharge: 2024-06-17 | Disposition: A | Discharge status: Home / Self Care | Payer: MEDICAID | Attending: Emergency Medicine | Admitting: Emergency Medicine

## 2024-06-17 ENCOUNTER — Emergency Department: Payer: MEDICAID

## 2024-06-17 DIAGNOSIS — R109 Unspecified abdominal pain: Secondary | ICD-10-CM

## 2024-06-17 DIAGNOSIS — Z3A08 8 weeks gestation of pregnancy: Secondary | ICD-10-CM | POA: Diagnosis not present

## 2024-06-17 DIAGNOSIS — R102 Pelvic and perineal pain unspecified side: Secondary | ICD-10-CM | POA: Diagnosis not present

## 2024-06-17 DIAGNOSIS — O26891 Other specified pregnancy related conditions, first trimester: Secondary | ICD-10-CM | POA: Insufficient documentation

## 2024-06-17 DIAGNOSIS — R000 Tachycardia, unspecified: Secondary | ICD-10-CM | POA: Diagnosis not present

## 2024-06-17 LAB — CBC WITH DIFFERENTIAL/PLATELET
Abs Immature Granulocytes: 0.04 K/uL (ref 0.00–0.07)
Basophils Absolute: 0.1 K/uL (ref 0.0–0.1)
Basophils Relative: 1 %
Eosinophils Absolute: 0.1 K/uL (ref 0.0–0.5)
Eosinophils Relative: 1 %
HCT: 35.4 % — ABNORMAL LOW (ref 36.0–46.0)
Hemoglobin: 12 g/dL (ref 12.0–15.0)
Immature Granulocytes: 0 %
Lymphocytes Relative: 31 %
Lymphs Abs: 3.2 K/uL (ref 0.7–4.0)
MCH: 28.1 pg (ref 26.0–34.0)
MCHC: 33.9 g/dL (ref 30.0–36.0)
MCV: 82.9 fL (ref 80.0–100.0)
Monocytes Absolute: 0.6 K/uL (ref 0.1–1.0)
Monocytes Relative: 6 %
Neutro Abs: 6.2 K/uL (ref 1.7–7.7)
Neutrophils Relative %: 61 %
Platelets: 293 K/uL (ref 150–400)
RBC: 4.27 MIL/uL (ref 3.87–5.11)
RDW: 12.7 % (ref 11.5–15.5)
WBC: 10.2 K/uL (ref 4.0–10.5)
nRBC: 0 % (ref 0.0–0.2)

## 2024-06-17 LAB — HCG, QUANTITATIVE, PREGNANCY: hCG, Beta Chain, Quant, S: 181190 m[IU]/mL — ABNORMAL HIGH (ref <5)

## 2024-06-17 LAB — CHLAMYDIA/NGC RT PCR (ARMC ONLY)
Chlamydia Tr: NOT DETECTED
N gonorrhoeae: NOT DETECTED

## 2024-06-17 LAB — WET PREP, GENITAL
Clue Cells Wet Prep HPF POC: NONE SEEN
Sperm: NONE SEEN
Trich, Wet Prep: NONE SEEN
WBC, Wet Prep HPF POC: <10 (ref <10)
Yeast Wet Prep HPF POC: NONE SEEN

## 2024-06-17 LAB — URINALYSIS, ROUTINE W REFLEX MICROSCOPIC
Bilirubin Urine: NEGATIVE
Glucose, UA: NEGATIVE mg/dL
Hgb urine dipstick: NEGATIVE
Ketones, ur: NEGATIVE mg/dL
Leukocytes,Ua: NEGATIVE
Nitrite: NEGATIVE
Protein, ur: NEGATIVE mg/dL
Specific Gravity, Urine: 1.015 (ref 1.005–1.030)
pH: 6 (ref 5.0–8.0)

## 2024-06-17 NOTE — ED Provider Notes (Signed)
 [facility] Provider Note    Event Date/Time   First MD Initiated Contact with Patient 06/17/24 0115     (approximate)   History   Abdominal Cramping   HPI  Karen Choi is a 18 y.o. female presents to the emergency department with abdominal cramping.  States that she has been having some lower abdominal pain and back pain.  Currently [redacted] weeks gestational age.  States that she just completed treatment for bacterial vaginosis.  Denies any ongoing vaginal discharge.  Denies any burning with urination or urinary urgency or frequency.  Denies any blood in her urine.  No falls or trauma.  Denies any vaginal bleeding.  Followed by obstetrician.  No fever or chills.  No history of kidney stone.  States that her pain is worse on the left side.     Physical Exam   Triage Vital Signs: ED Triage Vitals  Encounter Vitals Group     BP 06/17/24 0024 130/82     Girls Systolic BP Percentile --      Girls Diastolic BP Percentile --      Boys Systolic BP Percentile --      Boys Diastolic BP Percentile --      Pulse Rate 06/17/24 0024 (!) 111     Resp 06/17/24 0024 18     Temp 06/17/24 0024 98.4 F (36.9 C)     Temp Source 06/17/24 0024 Oral     SpO2 06/17/24 0024 100 %     Weight 06/17/24 0024 130 lb (59 kg)     Height 06/17/24 0024 5' 0.5 (1.537 m)     Head Circumference --      Peak Flow --      Pain Score 06/17/24 0030 2     Pain Loc --      Pain Education --      Exclude from Growth Chart --     Most recent vital signs: Vitals:   06/17/24 0146 06/17/24 0322  BP:  110/70  Pulse:  (!) 106  Resp:    Temp:    SpO2: 100% 100%    Physical Exam Constitutional:      Appearance: She is well-developed.  HENT:     Head: Atraumatic.  Eyes:     Conjunctiva/sclera: Conjunctivae normal.  Cardiovascular:     Rate and Rhythm: Regular rhythm. Tachycardia present.  Pulmonary:     Effort: No respiratory distress.  Abdominal:     General: There is  no distension.     Tenderness: There is no abdominal tenderness. There is no right CVA tenderness or left CVA tenderness.  Musculoskeletal:        General: Normal range of motion.     Cervical back: Normal range of motion.     Right lower leg: No edema.     Left lower leg: No edema.  Skin:    General: Skin is warm.     Capillary Refill: Capillary refill takes less than 2 seconds.  Neurological:     General: No focal deficit present.     Mental Status: She is alert. Mental status is at baseline.     IMPRESSION / MDM / ASSESSMENT AND PLAN / ED COURSE  I reviewed the triage vital signs and the nursing notes.  Differential diagnosis including ectopic pregnancy, miscarriage, pyelonephritis, urinary tract infection, bacterial vaginosis, trichomonas, STI, kidney stone RADIOLOGY I independently reviewed imaging, my interpretation of imaging: Transvaginal ultrasound with 8 weeks intrauterine pregnancy  with questionable small subchorionic hemorrhage.  LABS (all labs ordered are listed, but only abnormal results are displayed) Labs interpreted as -    Labs Reviewed  CBC WITH DIFFERENTIAL/PLATELET - Abnormal; Notable for the following components:      Result Value   HCT 35.4 (*)    All other components within normal limits  URINALYSIS, ROUTINE W REFLEX MICROSCOPIC - Abnormal; Notable for the following components:   Color, Urine YELLOW (*)    APPearance CLEAR (*)    All other components within normal limits  HCG, QUANTITATIVE, PREGNANCY - Abnormal; Notable for the following components:   hCG, Beta Chain, Quant, S 181,190 (*)    All other components within normal limits  WET PREP, GENITAL  CHLAMYDIA/NGC RT PCR ([facility] ONLY)               MDM  Lab work with no significant leukocytosis.  No significant anemia and platelets are normal.  Wet prep and clue cells are negative.  No signs of chlamydia or gonorrhea.  UA with no signs of urinary tract infection, no blood, low suspicion for  kidney stone.  Beta-hCG 181.  Ultrasound with IUP estimating [redacted] weeks gestational age and 0 days.  On reevaluation patient states she is feeling better.  Repeat exam is otherwise benign with no significant abdominal tenderness palpation of the low suspicion for ovarian torsion, kidney stone, pyelonephritis, acute diverticulitis or other acute intra-abdominal pathology.  Discussed close follow-up as an outpatient with obstetrician.  Discussed return precautions for any ongoing or worsening symptoms.     PROCEDURES:  Critical Care performed: No  Procedures  Patient's presentation is most consistent with acute presentation with potential threat to life or bodily function.   MEDICATIONS ORDERED IN ED: Medications - No data to display  FINAL CLINICAL IMPRESSION(S) / ED DIAGNOSES   Final diagnoses:  Pelvic pain in female  Abdominal cramping  [redacted] weeks gestation of pregnancy     Rx / DC Orders   ED Discharge Orders     None        Note:  This document was prepared using Dragon voice recognition software and may include unintentional dictation errors.   Suzanne Kirsch, MD 06/17/24 [phone]

## 2024-06-17 NOTE — Discharge Instructions (Signed)
 You are seen in the emergency department for left-sided abdominal pain.  You did not have any findings of bacterial vaginosis.  Your ultrasound showed [redacted] weeks gestational age.  Your lab work was otherwise at your normal.  It is importantly follow-up closely with your obstetrician.  Call to schedule close follow-up appointment in the next 1 to 2 days.  Return to the emergency department for any worsening or ongoing symptoms.

## 2024-06-17 NOTE — ED Triage Notes (Signed)
 Pt to ed from home via POV for abd cramping and some vaginal discharge but no bleeding. Pt is [redacted] weeks pregnant. Pt is currently being treated for BV. Pt is caox4, in no acute distress and ambulatory to triage.

## 2024-06-29 ENCOUNTER — Ambulatory Visit: Payer: MEDICAID

## 2024-06-29 DIAGNOSIS — O219 Vomiting of pregnancy, unspecified: Secondary | ICD-10-CM

## 2024-06-29 DIAGNOSIS — Z3689 Encounter for other specified antenatal screening: Secondary | ICD-10-CM

## 2024-06-29 DIAGNOSIS — Z3401 Encounter for supervision of normal first pregnancy, first trimester: Secondary | ICD-10-CM | POA: Insufficient documentation

## 2024-06-29 MED ORDER — ONDANSETRON 4 MG PO TBDP: 4.0000 mg | ORAL_TABLET | Freq: Three times a day (TID) | ORAL | 1 refills | Status: DC | PRN

## 2024-06-29 NOTE — Patient Instructions (Signed)

## 2024-06-29 NOTE — Progress Notes (Signed)
 New OB Intake  I explained I am completing New OB Intake today. We discussed her EDD of 01/24/25 that is based on LMP of 04/19/2024. Pt is G2/P0. I reviewed her allergies, medications, Medical/Surgical/OB history, and appropriate screenings. Patient has outdoor cats but she reports they do not use a litter box. Based on history, this is an uncomplicated pregnancy.  Patient has no significant obstetrical history.  Patient Active Problem List   Diagnosis Date Noted   Encounter for supervision of normal first pregnancy in first trimester [Z34.01] 06/29/2024    Concerns addressed today: Patient continues to struggle with nausea.  Was told to take Vitamin B6 and Unisom, but reports no relief.  She requests ODT Zofran, as she has used it in the past and it has been helpful.  Rx sent to patient pharmacy.  Delivery Plans:  Plans to deliver at [facility].  Anatomy US  Patient had first trimester ultrasound 06/12/24.  Anatomy US  will be scheduled around [redacted] weeks gestational age.  Labs Discussed genetic screening with patient. Patient desires genetic testing to be drawn at new OB visit. Discussed possible labs to be drawn at new OB appointment.  COVID Vaccine Patient has not had COVID vaccine.   Social Determinants of Health Food Insecurity: denies food insecurity WIC Referral: Patient is interested in referral to [facility].  Transportation: Patient denies transportation needs. Childcare: Discussed no children allowed at ultrasound appointments.   First visit review I reviewed new OB appt with pt. I explained she will have blood work and pap smear/pelvic exam if indicated. Explained pt will be seen by Jinnie Cookey CNM at first visit; encounter routed to appropriate provider.   Rollo FORBES Louder, RN 06/29/2024  9:44 AM

## 2024-07-04 ENCOUNTER — Other Ambulatory Visit: Payer: Self-pay

## 2024-07-04 ENCOUNTER — Encounter: Payer: Self-pay | Admitting: Intensive Care

## 2024-07-04 ENCOUNTER — Emergency Department
Admission: EM | Admit: 2024-07-04 | Discharge: 2024-07-04 | Disposition: A | Discharge status: Home / Self Care | Payer: MEDICAID | Attending: Emergency Medicine | Admitting: Emergency Medicine

## 2024-07-04 DIAGNOSIS — O26899 Other specified pregnancy related conditions, unspecified trimester: Secondary | ICD-10-CM

## 2024-07-04 DIAGNOSIS — Z3A10 10 weeks gestation of pregnancy: Secondary | ICD-10-CM | POA: Insufficient documentation

## 2024-07-04 DIAGNOSIS — R102 Pelvic and perineal pain unspecified side: Secondary | ICD-10-CM | POA: Insufficient documentation

## 2024-07-04 DIAGNOSIS — O26891 Other specified pregnancy related conditions, first trimester: Secondary | ICD-10-CM | POA: Diagnosis present

## 2024-07-04 LAB — CBC WITH DIFFERENTIAL/PLATELET
Abs Immature Granulocytes: 0.01 K/uL (ref 0.00–0.07)
Basophils Absolute: 0 K/uL (ref 0.0–0.1)
Basophils Relative: 0 %
Eosinophils Absolute: 0.1 K/uL (ref 0.0–0.5)
Eosinophils Relative: 1 %
HCT: 34.2 % — ABNORMAL LOW (ref 36.0–46.0)
Hemoglobin: 11.5 g/dL — ABNORMAL LOW (ref 12.0–15.0)
Immature Granulocytes: 0 %
Lymphocytes Relative: 26 %
Lymphs Abs: 1.8 K/uL (ref 0.7–4.0)
MCH: 28.3 pg (ref 26.0–34.0)
MCHC: 33.6 g/dL (ref 30.0–36.0)
MCV: 84 fL (ref 80.0–100.0)
Monocytes Absolute: 0.5 K/uL (ref 0.1–1.0)
Monocytes Relative: 7 %
Neutro Abs: 4.4 K/uL (ref 1.7–7.7)
Neutrophils Relative %: 66 %
Platelets: 261 K/uL (ref 150–400)
RBC: 4.07 MIL/uL (ref 3.87–5.11)
RDW: 13.3 % (ref 11.5–15.5)
WBC: 6.8 K/uL (ref 4.0–10.5)
nRBC: 0 % (ref 0.0–0.2)

## 2024-07-04 LAB — URINALYSIS, ROUTINE W REFLEX MICROSCOPIC
Bilirubin Urine: NEGATIVE
Glucose, UA: 150 mg/dL — AB
Hgb urine dipstick: NEGATIVE
Ketones, ur: NEGATIVE mg/dL
Leukocytes,Ua: NEGATIVE
Nitrite: NEGATIVE
Protein, ur: NEGATIVE mg/dL
Specific Gravity, Urine: 1.015 (ref 1.005–1.030)
pH: 6 (ref 5.0–8.0)

## 2024-07-04 LAB — COMPREHENSIVE METABOLIC PANEL WITH GFR
ALT: 12 U/L (ref 0–44)
AST: 19 U/L (ref 15–41)
Albumin: 3.6 g/dL (ref 3.5–5.0)
Alkaline Phosphatase: 45 U/L (ref 38–126)
Anion gap: 7 (ref 5–15)
BUN: 5 mg/dL — ABNORMAL LOW (ref 6–20)
CO2: 22 mmol/L (ref 22–32)
Calcium: 9 mg/dL (ref 8.9–10.3)
Chloride: 107 mmol/L (ref 98–111)
Creatinine, Ser: 0.49 mg/dL (ref 0.44–1.00)
GFR, Estimated: >60 mL/min (ref >60)
Glucose, Bld: 102 mg/dL — ABNORMAL HIGH (ref 70–99)
Potassium: 3.8 mmol/L (ref 3.5–5.1)
Sodium: 136 mmol/L (ref 135–145)
Total Bilirubin: 0.3 mg/dL (ref 0.0–1.2)
Total Protein: 7.5 g/dL (ref 6.5–8.1)

## 2024-07-04 LAB — WET PREP, GENITAL
Clue Cells Wet Prep HPF POC: NONE SEEN
Sperm: NONE SEEN
Trich, Wet Prep: NONE SEEN
WBC, Wet Prep HPF POC: <10 (ref <10)
Yeast Wet Prep HPF POC: NONE SEEN

## 2024-07-04 LAB — CHLAMYDIA/NGC RT PCR (ARMC ONLY)
Chlamydia Tr: NOT DETECTED
N gonorrhoeae: NOT DETECTED

## 2024-07-04 LAB — HCG, QUANTITATIVE, PREGNANCY: hCG, Beta Chain, Quant, S: 166380 m[IU]/mL — ABNORMAL HIGH (ref <5)

## 2024-07-04 NOTE — ED Provider Notes (Signed)
 [facility] Provider Note    Event Date/Time   First MD Initiated Contact with Patient 07/04/24 1035     (approximate)   History   Pelvic Pain   HPI  Karen Choi is a 18 y.o. female G2 P0-0-1-0 presents for evaluation of pelvic pain.  Endorses abnormal discharge for her, describes it as white with a slight odor.  She does endorse a history of BV and does not have concerns for STDs at this time.  Denies nausea, vomiting, fevers, bleeding.  Patient is about [redacted] weeks pregnant.      Physical Exam   Triage Vital Signs: ED Triage Vitals  Encounter Vitals Group     BP 07/04/24 1021 121/81     Girls Systolic BP Percentile --      Girls Diastolic BP Percentile --      Boys Systolic BP Percentile --      Boys Diastolic BP Percentile --      Pulse Rate 07/04/24 1021 92     Resp 07/04/24 1021 18     Temp 07/04/24 1021 98.3 F (36.8 C)     Temp Source 07/04/24 1021 Oral     SpO2 07/04/24 1021 100 %     Weight 07/04/24 1021 130 lb (59 kg)     Height 07/04/24 1021 5' (1.524 m)     Head Circumference --      Peak Flow --      Pain Score 07/04/24 1024 7     Pain Loc --      Pain Education --      Exclude from Growth Chart --     Most recent vital signs: Vitals:   07/04/24 1021  BP: 121/81  Pulse: 92  Resp: 18  Temp: 98.3 F (36.8 C)  SpO2: 100%     General: Awake, no distress.  CV:  Good peripheral perfusion.  RRR. Resp:  Normal effort.  CTAB Abd:  No distention.  Soft, mild tenderness to palpation suprapubically. Pelvic:  No lesions noted on vulva or labia, white discharge in the vaginal vault, no adnexal or cervical motion tenderness on bimanual exam.   ED Results / Procedures / Treatments   Labs (all labs ordered are listed, but only abnormal results are displayed) Labs Reviewed  CBC WITH DIFFERENTIAL/PLATELET - Abnormal; Notable for the following components:      Result Value   Hemoglobin 11.5 (*)    HCT 34.2 (*)     All other components within normal limits  COMPREHENSIVE METABOLIC PANEL WITH GFR - Abnormal; Notable for the following components:   Glucose, Bld 102 (*)    BUN 5 (*)    All other components within normal limits  HCG, QUANTITATIVE, PREGNANCY - Abnormal; Notable for the following components:   hCG, Beta Chain, Quant, S [id number] (*)    All other components within normal limits  URINALYSIS, ROUTINE W REFLEX MICROSCOPIC - Abnormal; Notable for the following components:   Color, Urine YELLOW (*)    APPearance HAZY (*)    Glucose, UA 150 (*)    All other components within normal limits  WET PREP, GENITAL  CHLAMYDIA/NGC RT PCR ([facility] ONLY)              RADIOLOGY  =   PROCEDURES:  Critical Care performed: No  Procedures   MEDICATIONS ORDERED IN ED: Medications - No data to display   IMPRESSION / MDM / ASSESSMENT AND PLAN / ED COURSE  I reviewed the triage vital signs and the nursing notes.                             18 year old female who is about 20 [redacted] weeks pregnant presents for evaluation of pelvic pain.  Vital signs are stable patient NAD on exam.  Differential diagnosis includes, but is not limited to, STD, bacterial vaginosis, yeast infection, UTI, ovarian cyst, ovarian torsion, miscarriage.  Patient's presentation is most consistent with acute complicated illness / injury requiring diagnostic workup.  CBC shows mild anemia but is otherwise unremarkable.  CMP is unremarkable.  Beta-hCG is appropriate for gestational age.  Urinalysis shows glucose but is otherwise normal.  Wet prep is negative.  Gonorrhea chlamydia test is pending.  Patient did have some mild tenderness to palpation suprapubically but otherwise physical exam was reassuring.  Patient has had previous pelvic ultrasounds that show an ovarian cyst on the left side but pain is more generalized across the lower abdomen, very low suspicion for ovarian torsion given that patient's pain is mild and is not localized to 1  side.  Do not suspect miscarriage as patient has not had any bleeding.  This may be early round ligament pain or muscle strain versus constipation.  Discussed taking Tylenol as needed for pain and following up with her OB/GYN.  Patient voiced understanding, all questions were answered and she was stable at discharge.     FINAL CLINICAL IMPRESSION(S) / ED DIAGNOSES   Final diagnoses:  Pelvic pain in pregnancy     Rx / DC Orders   ED Discharge Orders     None        Note:  This document was prepared using Dragon voice recognition software and may include unintentional dictation errors.   Cleaster Tinnie LABOR, PA-C 07/04/24 1311    Dicky Anes, MD 07/04/24 1555

## 2024-07-04 NOTE — ED Triage Notes (Signed)
 Reports she is 10w 6d pregnant. C/o lower back pain and pelvic pain. Reports some white and creamy discharge. Reports slight odor.   History BV

## 2024-07-04 NOTE — Discharge Instructions (Signed)
 Your blood work, and urinalysis was normal today.  You tested negative for yeast, bacterial vaginosis and trichomonas.  The gonorrhea and Chlamydia test is still pending.  Please follow-up with these results on MyChart.  If you test positive for either 1 of these you can seek treatment at the emergency department, urgent care or the health department.  Please follow-up with your OB/GYN return to the emergency department with any worsening symptoms.

## 2024-07-16 NOTE — Progress Notes (Signed)
 NEW OB HISTORY AND PHYSICAL  SUBJECTIVE:       Karen Choi is a 18 y.o. G50P0010 female, Patient's last menstrual period was 04/19/2024 (exact date)., Estimated Date of Delivery: 01/24/25, [redacted]w[redacted]d, presents today for establishment of Prenatal Care. This pregnancy was a surprise, was using condoms that broke when she conceived. Is feeling good about being pregnant She would like to breastfeed  She reports fatigue and nausea-tried B6 did not help, Zofran helping,  Occasional vomiting Has been seen in the ED for abdominal pain   HX Asthma: has inhaler, last used 2 weeks was admitted to [facility]  Had COVID in August   Social history Partner/Relationship:Boyfriend, Chance, this is his first child  Living situation: Lives with her mother and her boyfriend  Work: Statistician Exercise:walks her dog  Substance use: stopped tobacco and MJ in early pregnancy, denies alcohol   Indications for ASA therapy (per uptodate) One of the following: Previous pregnancy with preeclampsia, especially early onset and with an adverse outcome No Multifetal gestation No Chronic hypertension No Type 1 or 2 diabetes mellitus No Chronic kidney disease No Autoimmune disease (antiphospholipid syndrome, systemic lupus erythematosus) No  Two or more of the following: Nulliparity Yes Obesity (body mass index >30 kg/m2) No Family history of preeclampsia in mother or sister No Age >=35 years No Sociodemographic characteristics (African American race, low socioeconomic level) Yes Personal risk factors (eg, previous pregnancy with low birth weight or small for gestational age infant, previous adverse pregnancy outcome [eg, stillbirth], interval >10 years between pregnancies) No  Indications for early GDM screening  First-degree relative with diabetes No BMI >30kg/m2 No Age > 35 No Previous birth of an infant weighing >=4000 g No Gestational diabetes mellitus in a previous pregnancy No Glycated hemoglobin  >=5.7 percent (39 mmol/mol), impaired glucose tolerance, or impaired fasting glucose on previous testing No High-risk race/ethnicity (eg, African American, Latino, Native American, Asian American, Pacific Islander) Yes Previous stillbirth of unknown cause No Maternal birthweight > 9 lbs No History of cardiovascular disease No Hypertension or on therapy for hypertension No High-density lipoprotein cholesterol level <35 mg/dL (9.09 mmol/L) and/or a triglyceride level >250 mg/dL (7.17 mmol/L) No Polycystic ovary syndrome No Physical inactivity Yes Other clinical condition associated with insulin resistance (eg, severe obesity, acanthosis nigricans) No Current use of glucocorticoids No   Early screening tests: FBS, A1C, Random CBG, glucose challenge  Gynecologic History Patient's last menstrual period was 04/19/2024 (exact date). Normal Contraception: condoms Last Pap: never collected due to age  Obstetric History OB History  Gravida Para Term Preterm AB Living  2 0 0 0 1 0  SAB IAB Ectopic Multiple Live Births  0 1 0 0 0    # Outcome Date GA Lbr Len/2nd Weight Sex Type Anes PTL Lv  2 Current           1 IAB 02/14/24 [redacted]w[redacted]d           Past Medical History:  Diagnosis Date   Abdominal pain, RLQ (right lower quadrant) 05/19/2022   Asthma    BV (bacterial vaginosis)    Chlamydia 05/15/2022    Past Surgical History:  Procedure Laterality Date   SKIN TAG REMOVAL     TONSILLECTOMY      Current Outpatient Medications on File Prior to Visit  Medication Sig Dispense Refill   albuterol  (VENTOLIN  HFA) 108 (90 Base) MCG/ACT inhaler Inhale 2 puffs into the lungs every 6 (six) hours as needed for shortness of breath or  wheezing. 8 g 2   cetirizine (ZYRTEC) 10 MG tablet TAKE 1 TABLET BY MOUTH ONCE DAILY FOR ALLERGIES     ondansetron (ZOFRAN-ODT) 4 MG disintegrating tablet Take 1 tablet (4 mg total) by mouth every 8 (eight) hours as needed for nausea or vomiting. 30 tablet 1   Prenatal  Vit-Fe Fumarate-FA (MULTIVITAMIN-PRENATAL) 27-0.8 MG TABS tablet Take 1 tablet by mouth daily at 12 noon.     azithromycin  (ZITHROMAX ) 250 MG tablet Take 250 mg by mouth. (Patient not taking: Reported on 06/29/2024)     famotidine  (PEPCID ) 20 MG tablet Take 1 tablet (20 mg total) by mouth 2 (two) times daily. (Patient not taking: Reported on 06/29/2024) 30 tablet 0   fluconazole  (DIFLUCAN ) 150 MG tablet Take 150 mg by mouth once. (Patient not taking: Reported on 06/29/2024)     metroNIDAZOLE  (METROGEL ) 0.75 % vaginal gel Place 1 Applicatorful vaginally at bedtime. Apply one applicatorful to vagina at bedtime for 5 days (Patient not taking: Reported on 06/29/2024) 70 g 1   ondansetron (ZOFRAN-ODT) 4 MG disintegrating tablet Take 4 mg by mouth every 8 (eight) hours as needed. (Patient not taking: Reported on 06/29/2024)     No current facility-administered medications on file prior to visit.    No Known Allergies  Social History   Socioeconomic History   Marital status: Single    Spouse name: Not on file   Number of children: Not on file   Years of education: Not on file   Highest education level: Not on file  Occupational History   Not on file  Tobacco Use   Smoking status: Never    Passive exposure: Past   Smokeless tobacco: Former  Advertising Account Planner   Vaping status: Former  Substance and Sexual Activity   Alcohol use: Not Currently   Drug use: Not Currently    Frequency: 2.0 times per week    Types: Marijuana   Sexual activity: Yes    Birth control/protection: None  Other Topics Concern   Not on file  Social History Narrative   Not on file   Social Drivers of Health   Financial Resource Strain: Not on file  Food Insecurity: No Food Insecurity (06/29/2024)   Hunger Vital Sign    Worried About Running Out of Food in the Last Year: Never true    Ran Out of Food in the Last Year: Never true  Transportation Needs: No Transportation Needs (06/29/2024)   PRAPARE - Therapist, Art (Medical): No    Lack of Transportation (Non-Medical): No  Physical Activity: Not on file  Stress: Not on file  Social Connections: Not on file  Intimate Partner Violence: Not At Risk (06/29/2024)   Humiliation, Afraid, Rape, and Kick questionnaire    Fear of Current or Ex-Partner: No    Emotionally Abused: No    Physically Abused: No    Sexually Abused: No    Family History  Problem Relation Age of Onset   Breast cancer Mother        maybe 43   Hypertension Father    Cancer - Cervical Maternal Grandmother        unsure of age    The following portions of the patient's history were reviewed and updated as appropriate: allergies, current medications, past OB history, past medical history, past surgical history, past family history, past social history, and problem list.  Constitutional: Denied constitutional symptoms, night sweats, recent illness,, fever, insomnia and weight loss. Yes fatigue  Eyes: Denied eye symptoms, eye pain, photophobia, vision change and visual disturbance.  Ears/Nose/Throat/Neck: Denied ear, nose, throat or neck symptoms, hearing loss, nasal discharge, sinus congestion and sore throat.  Cardiovascular: Denied cardiovascular symptoms, arrhythmia, chest pain/pressure, edema, exercise intolerance, orthopnea and palpitations.  Respiratory: Denied pulmonary symptoms, asthma, pleuritic pain, productive sputum, cough, dyspnea and wheezing.  Gastrointestinal: Denied gastro-esophageal reflux, melena,  Yes, nausea and vomiting.  Genitourinary: Denied genitourinary symptoms including symptomatic vaginal discharge, pelvic relaxation issues, and urinary complaints.  Musculoskeletal: Denied musculoskeletal symptoms, stiffness, swelling, muscle weakness and myalgia.  Dermatologic: Denied dermatology symptoms, rash and scar.  Neurologic: Denied neurology symptoms, dizziness, headache, neck pain and syncope.  Psychiatric: Denied psychiatric symptoms,  anxiety and depression.  Endocrine: Denied endocrine symptoms including hot flashes and night sweats.     OBJECTIVE: Initial Physical Exam (New OB)  Physical Exam Constitutional:      Appearance: Normal appearance.  Cardiovascular:     Rate and Rhythm: Normal rate and regular rhythm.     Pulses: Normal pulses.     Heart sounds: Normal heart sounds.  Pulmonary:     Effort: Pulmonary effort is normal.     Breath sounds: Normal breath sounds.  Chest:     Comments: Breasts: soft no redness or mass, nipples intact bilaterally  Abdominal:     Tenderness: There is no abdominal tenderness.     Comments: Heart tones 150  Genitourinary:    Comments: Exam deferred  Musculoskeletal:        General: Normal range of motion.     Cervical back: Normal range of motion.  Skin:    General: Skin is warm.  Neurological:     General: No focal deficit present.  Psychiatric:        Mood and Affect: Mood normal.        ASSESSMENT: Normal pregnancy   PLAN: Routine prenatal care. We discussed an overview of prenatal care and when to call. Reviewed diet, exercise, and weight gain recommendations in pregnancy. Discussed benefits of breastfeeding and lactation resources at [facility]. I reviewed labs and answered all questions.  1. Encounter for supervision of normal first pregnancy in first trimester (Primary) - NOB Panel - Culture, OB Urine - Monitor Drug Profile 14(MW) - Nicotine screen, urine - Urinalysis, Routine w reflex microscopic - Toxoplasma antibodies- IgG and  IgM - Hemoglobin A1c - Hgb Fractionation Cascade - Cervicovaginal ancillary only - MaterniT21 PLUS Core - US  OB Comp + 14 Wk; Future  2. [redacted] weeks gestation of pregnancy - NOB Panel - Culture, OB Urine - Monitor Drug Profile 14(MW) - Nicotine screen, urine - Urinalysis, Routine w reflex microscopic - Toxoplasma antibodies- IgG and  IgM - Hemoglobin A1c - Hgb Fractionation Cascade - Cervicovaginal ancillary only -  MaterniT21 PLUS Core  3. Encounter for drug screening - Monitor Drug Profile 14(MW)  4. Exposure to cat feces, initial encounter - Toxoplasma antibodies- IgG and  IgM  5. Screening examination for STD (sexually transmitted disease) - Cervicovaginal ancillary only  6. Genetic screening - MaterniT21 PLUS Core  7. Encounter for fetal ultrasound - US  OB Comp + 14 Wk; Future   Treanna Dumler M Lelar Farewell, CNM

## 2024-07-17 ENCOUNTER — Ambulatory Visit: Payer: MEDICAID | Admitting: Licensed Practical Nurse

## 2024-07-17 ENCOUNTER — Other Ambulatory Visit (HOSPITAL_COMMUNITY)
Admission: RE | Admit: 2024-07-17 | Discharge: 2024-07-17 | Disposition: A | Discharge status: Home / Self Care | Payer: MEDICAID | Source: Ambulatory Visit | Attending: Licensed Practical Nurse | Admitting: Licensed Practical Nurse

## 2024-07-17 VITALS — BP 117/71 | HR 85 | Wt 128.2 lb

## 2024-07-17 DIAGNOSIS — Z3A12 12 weeks gestation of pregnancy: Secondary | ICD-10-CM

## 2024-07-17 DIAGNOSIS — Z369 Encounter for antenatal screening, unspecified: Secondary | ICD-10-CM

## 2024-07-17 DIAGNOSIS — T7589XA Other specified effects of external causes, initial encounter: Secondary | ICD-10-CM

## 2024-07-17 DIAGNOSIS — Z3401 Encounter for supervision of normal first pregnancy, first trimester: Secondary | ICD-10-CM | POA: Insufficient documentation

## 2024-07-17 DIAGNOSIS — Z0283 Encounter for blood-alcohol and blood-drug test: Secondary | ICD-10-CM

## 2024-07-17 DIAGNOSIS — Z1379 Encounter for other screening for genetic and chromosomal anomalies: Secondary | ICD-10-CM

## 2024-07-17 DIAGNOSIS — Z113 Encounter for screening for infections with a predominantly sexual mode of transmission: Secondary | ICD-10-CM

## 2024-07-18 LAB — CERVICOVAGINAL ANCILLARY ONLY
Bacterial Vaginitis (gardnerella): POSITIVE — AB
Candida Glabrata: NEGATIVE
Candida Vaginitis: NEGATIVE
Chlamydia: NEGATIVE
Comment: NEGATIVE
Comment: NEGATIVE
Comment: NEGATIVE
Comment: NEGATIVE
Comment: NEGATIVE
Comment: NORMAL
Neisseria Gonorrhea: NEGATIVE
Trichomonas: NEGATIVE

## 2024-07-18 LAB — URINALYSIS, ROUTINE W REFLEX MICROSCOPIC
Bilirubin, UA: NEGATIVE
Glucose, UA: NEGATIVE
Ketones, UA: NEGATIVE
Leukocytes,UA: NEGATIVE
Nitrite, UA: NEGATIVE
Protein,UA: NEGATIVE
RBC, UA: NEGATIVE
Specific Gravity, UA: 1.01 (ref 1.005–1.030)
Urobilinogen, Ur: 0.2 mg/dL (ref 0.2–1.0)
pH, UA: 7.5 (ref 5.0–7.5)

## 2024-07-19 LAB — CBC/D/PLT+RPR+RH+ABO+RUBIGG...
Antibody Screen: NEGATIVE
Basophils Absolute: 0 x10E3/uL (ref 0.0–0.2)
Basos: 1 %
EOS (ABSOLUTE): 0.1 x10E3/uL (ref 0.0–0.4)
Eos: 1 %
HCV Ab: NONREACTIVE
HIV Screen 4th Generation wRfx: NONREACTIVE
Hematocrit: 35.2 % (ref 34.0–46.6)
Hemoglobin: 11.5 g/dL (ref 11.1–15.9)
Hepatitis B Surface Ag: NEGATIVE
Immature Grans (Abs): 0 x10E3/uL (ref 0.0–0.1)
Immature Granulocytes: 0 %
Lymphocytes Absolute: 1.9 x10E3/uL (ref 0.7–3.1)
Lymphs: 35 %
MCH: 28.5 pg (ref 26.6–33.0)
MCHC: 32.7 g/dL (ref 31.5–35.7)
MCV: 87 fL (ref 79–97)
Monocytes Absolute: 0.4 x10E3/uL (ref 0.1–0.9)
Monocytes: 7 %
Neutrophils Absolute: 3 x10E3/uL (ref 1.4–7.0)
Neutrophils: 56 %
Platelets: 265 x10E3/uL (ref 150–450)
RBC: 4.03 x10E6/uL (ref 3.77–5.28)
RDW: 13.3 % (ref 11.7–15.4)
RPR Ser Ql: NONREACTIVE
Rh Factor: POSITIVE
Rubella Antibodies, IGG: 3.24 {index} (ref >0.99)
Varicella zoster IgG: REACTIVE
WBC: 5.5 x10E3/uL (ref 3.4–10.8)

## 2024-07-19 LAB — TOXOPLASMA ANTIBODIES- IGG AND  IGM
Toxoplasma Antibody- IgM: <3 [AU]/ml (ref 0.0–7.9)
Toxoplasma IgG Ratio: <3 [IU]/mL (ref 0.0–7.1)

## 2024-07-19 LAB — HGB FRACTIONATION CASCADE
Hgb A2: 2.7 % (ref 1.8–3.2)
Hgb A: 97.3 % (ref 96.4–98.8)
Hgb F: 0 % (ref 0.0–2.0)
Hgb S: 0 %

## 2024-07-19 LAB — HCV INTERPRETATION

## 2024-07-19 LAB — HEMOGLOBIN A1C
Est. average glucose Bld gHb Est-mCnc: 103 mg/dL
Hgb A1c MFr Bld: 5.2 % (ref 4.8–5.6)

## 2024-07-19 LAB — CULTURE, OB URINE

## 2024-07-19 LAB — URINE CULTURE, OB REFLEX

## 2024-07-20 ENCOUNTER — Other Ambulatory Visit: Payer: Self-pay

## 2024-07-20 MED ORDER — METRONIDAZOLE 0.75 % VA GEL: 1.0000 | Freq: Every day | VAGINAL | 1 refills | Status: DC

## 2024-07-20 NOTE — Telephone Encounter (Signed)
 Rx has been sent

## 2024-07-21 ENCOUNTER — Encounter: Payer: Self-pay | Admitting: Licensed Practical Nurse

## 2024-07-21 ENCOUNTER — Ambulatory Visit: Payer: Self-pay | Admitting: Licensed Practical Nurse

## 2024-07-21 LAB — NICOTINE SCREEN, URINE: Cotinine Ql Scrn, Ur: NEGATIVE ng/mL

## 2024-07-21 LAB — MONITOR DRUG PROFILE 14(MW)
Amphetamine Scrn, Ur: NEGATIVE ng/mL
BARBITURATE SCREEN URINE: NEGATIVE ng/mL
BENZODIAZEPINE SCREEN, URINE: NEGATIVE ng/mL
Buprenorphine, Urine: NEGATIVE ng/mL
Cocaine (Metab) Scrn, Ur: NEGATIVE ng/mL
Creatinine(Crt), U: 53.7 mg/dL (ref 20.0–300.0)
Fentanyl, Urine: NEGATIVE pg/mL
Meperidine Screen, Urine: NEGATIVE ng/mL
Methadone Screen, Urine: NEGATIVE ng/mL
OXYCODONE+OXYMORPHONE UR QL SCN: NEGATIVE ng/mL
Opiate Scrn, Ur: NEGATIVE ng/mL
Ph of Urine: 6.7 (ref 4.5–8.9)
Phencyclidine Qn, Ur: NEGATIVE ng/mL
Propoxyphene Scrn, Ur: NEGATIVE ng/mL
SPECIFIC GRAVITY: 1.008
Tramadol Screen, Urine: NEGATIVE ng/mL

## 2024-07-21 LAB — MATERNIT 21 PLUS CORE, BLOOD
Fetal Fraction: 21
Result (T21): NEGATIVE
Trisomy 13 (Patau syndrome): NEGATIVE
Trisomy 18 (Edwards syndrome): NEGATIVE
Trisomy 21 (Down syndrome): NEGATIVE

## 2024-07-21 LAB — CANNABINOID (GC/MS), URINE: Cannabinoid: NEGATIVE

## 2024-08-11 ENCOUNTER — Other Ambulatory Visit: Payer: Self-pay

## 2024-08-11 ENCOUNTER — Emergency Department
Admission: EM | Admit: 2024-08-11 | Discharge: 2024-08-11 | Disposition: A | Discharge status: Home / Self Care | Payer: MEDICAID | Attending: Emergency Medicine | Admitting: Emergency Medicine

## 2024-08-11 DIAGNOSIS — B9689 Other specified bacterial agents as the cause of diseases classified elsewhere: Secondary | ICD-10-CM | POA: Insufficient documentation

## 2024-08-11 DIAGNOSIS — O23592 Infection of other part of genital tract in pregnancy, second trimester: Secondary | ICD-10-CM | POA: Insufficient documentation

## 2024-08-11 DIAGNOSIS — M545 Low back pain, unspecified: Secondary | ICD-10-CM | POA: Diagnosis not present

## 2024-08-11 DIAGNOSIS — Z3A16 16 weeks gestation of pregnancy: Secondary | ICD-10-CM | POA: Insufficient documentation

## 2024-08-11 DIAGNOSIS — O26892 Other specified pregnancy related conditions, second trimester: Secondary | ICD-10-CM | POA: Diagnosis present

## 2024-08-11 LAB — URINALYSIS, ROUTINE W REFLEX MICROSCOPIC
Bacteria, UA: NONE SEEN
Bilirubin Urine: NEGATIVE
Glucose, UA: 50 mg/dL — AB
Hgb urine dipstick: NEGATIVE
Ketones, ur: NEGATIVE mg/dL
Nitrite: NEGATIVE
Protein, ur: NEGATIVE mg/dL
Specific Gravity, Urine: 1.011 (ref 1.005–1.030)
pH: 7 (ref 5.0–8.0)

## 2024-08-11 LAB — CHLAMYDIA/NGC RT PCR (ARMC ONLY)
Chlamydia Tr: NOT DETECTED
N gonorrhoeae: NOT DETECTED

## 2024-08-11 LAB — WET PREP, GENITAL
Sperm: NONE SEEN
Trich, Wet Prep: NONE SEEN
WBC, Wet Prep HPF POC: <10 (ref <10)
Yeast Wet Prep HPF POC: NONE SEEN

## 2024-08-11 LAB — PREGNANCY, URINE: Preg Test, Ur: POSITIVE — AB

## 2024-08-11 MED ORDER — LIDOCAINE 5 % EX PTCH
1.0000 | MEDICATED_PATCH | CUTANEOUS | Status: DC
  Administered 2024-08-11: 1 via TRANSDERMAL
  Filled 2024-08-11: qty 1

## 2024-08-11 MED ORDER — ACETAMINOPHEN 325 MG PO TABS
650.0000 mg | ORAL_TABLET | Freq: Once | ORAL | Status: AC
  Administered 2024-08-11: 650 mg via ORAL
  Filled 2024-08-11: qty 2

## 2024-08-11 NOTE — ED Provider Notes (Signed)
 [facility] Provider Note    Event Date/Time   First MD Initiated Contact with Patient 08/11/24 1054     (approximate)   History   Back Pain   HPI  Karen Choi is a 18 y.o. female G1 P0 currently estimated to be [redacted] weeks pregnant who presents today for evaluation of back pain.  Patient reports that she bent over while working and pick something up that was heavy and felt a sharp pain in her low back.  She denies any radiation of pain into her legs.  She denies any abdominal pain.  She denies any gush of fluids or vaginal bleeding.  No nausea or vomiting.  She wants to be sure that everything is okay with her baby.  She reports that she had some vaginal itchiness a few weeks ago and was diagnosed with BV, though she did not take the medicines.  She reports that her symptoms resolved but have recently returned over the last few days.  She would like to be retested.  No burning with urination.  Patient Active Problem List   Diagnosis Date Noted   Encounter for supervision of normal first pregnancy in first trimester [Z34.01] 06/29/2024          Physical Exam   Triage Vital Signs: ED Triage Vitals [08/11/24 1049]  Encounter Vitals Group     BP 122/75     Girls Systolic BP Percentile      Girls Diastolic BP Percentile      Boys Systolic BP Percentile      Boys Diastolic BP Percentile      Pulse      Resp 18     Temp 98.1 F (36.7 C)     Temp Source Oral     SpO2 98 %     Weight      Height      Head Circumference      Peak Flow      Pain Score 9     Pain Loc      Pain Education      Exclude from Growth Chart     Most recent vital signs: Vitals:   08/11/24 1049 08/11/24 1335  BP: 122/75 101/61  Pulse:  86  Resp: 18 18  Temp: 98.1 F (36.7 C) 98 F (36.7 C)  SpO2: 98% 99%    Physical Exam Vitals and nursing note reviewed.  Constitutional:      General: Awake and alert. No acute distress.    Appearance: Normal  appearance. The patient is normal weight.  HENT:     Head: Normocephalic and atraumatic.     Mouth: Mucous membranes are moist.  Eyes:     General: PERRL. Normal EOMs        Right eye: No discharge.        Left eye: No discharge.     Conjunctiva/sclera: Conjunctivae normal.  Cardiovascular:     Rate and Rhythm: Normal rate and regular rhythm.     Pulses: Normal pulses.  Pulmonary:     Effort: Pulmonary effort is normal. No respiratory distress.     Breath sounds: Normal breath sounds.  Abdominal:     Abdomen is soft. There is no abdominal tenderness. No rebound or guarding. No distention. Back: No midline tenderness.  Mild tenderness across the lumbar area.  Strength and sensation 5/5 to bilateral lower extremities. Normal great toe extension against resistance. Normal sensation throughout feet. Normal patellar reflexes. Negative  SLR and opposite SLR bilaterally. Negative FABER test.  Normal gait Musculoskeletal:        General: No swelling. Normal range of motion.     Cervical back: Normal range of motion and neck supple.  Skin:    General: Skin is warm and dry.     Capillary Refill: Capillary refill takes less than 2 seconds.     Findings: No rash.  Neurological:     Mental Status: The patient is awake and alert.      ED Results / Procedures / Treatments   Labs (all labs ordered are listed, but only abnormal results are displayed) Labs Reviewed  WET PREP, GENITAL - Abnormal; Notable for the following components:      Result Value   Clue Cells Wet Prep HPF POC PRESENT (*)    All other components within normal limits  URINALYSIS, ROUTINE W REFLEX MICROSCOPIC - Abnormal; Notable for the following components:   Color, Urine YELLOW (*)    APPearance HAZY (*)    Glucose, UA 50 (*)    Leukocytes,Ua MODERATE (*)    All other components within normal limits  PREGNANCY, URINE - Abnormal; Notable for the following components:   Preg Test, Ur POSITIVE (*)    All other components  within normal limits  CHLAMYDIA/NGC RT PCR ([facility] ONLY)               EKG     RADIOLOGY I independently reviewed and interpreted imaging and agree with radiologists findings.     PROCEDURES:  Critical Care performed:   Procedures   MEDICATIONS ORDERED IN ED: Medications  lidocaine (LIDODERM) 5 % 1 patch (1 patch Transdermal Patch Applied 08/11/24 1115)  acetaminophen (TYLENOL) tablet 650 mg (650 mg Oral Given 08/11/24 1115)     IMPRESSION / MDM / ASSESSMENT AND PLAN / ED COURSE  I reviewed the triage vital signs and the nursing notes.   Differential diagnosis includes, but is not limited to, lumbar strain, disc herniation, BV, STD, UTI.  Patient is awake and alert, hemodynamically stable and afebrile.  She is nontoxic in appearance and ambulatory with a steady gait.  She has no reproducible abdominal tenderness on exam.  Fetal heart tones are normal at 140.  Given her recurrence of vaginal itching, she agreed to BV and STD testing.  This was positive for BV.  Urinalysis obtained reveals leukocytes consistent with BV, she has no burning with urination, no nitrites, do not suspect UTI at this time.  No bacteriuria.  As for her low back pain, she has very minimal production of tenderness on exam.  No midline tenderness.  She has normal strength and sensation of bilateral lower extremities, no radicular type symptoms.  No urinary or fecal incontinence or retention and no saddle anesthesia.  She is able to ambulate with a steady gait.  She was treated symptomatically with Tylenol and Lidoderm patch with good effect.  She is requesting that her discharge paperwork today no heavy lifting and this was provided for her.  We discussed return precautions and outpatient follow-up.  Patient understands and agrees with plan.  She was discharged in stable condition.  Patient's presentation is most consistent with acute complicated illness / injury requiring diagnostic workup.   Clinical  Course as of 08/11/24 1640  Sat Aug 11, 2024  1247 Normal fetal heart tones [JP]    Clinical Course User Index [JP] Esther Broyles E, PA-C     FINAL CLINICAL IMPRESSION(S) / ED DIAGNOSES  Final diagnoses:  BV (bacterial vaginosis)  Acute bilateral low back pain without sciatica     Rx / DC Orders   ED Discharge Orders     None        Note:  This document was prepared using Dragon voice recognition software and may include unintentional dictation errors.   Ceciley Buist E, PA-C 08/11/24 1640    Arlander Charleston, MD 08/11/24 [phone]

## 2024-08-11 NOTE — Discharge Instructions (Addendum)
 Please do not perform any heavy lifting.  Your test is positive for bacterial vaginosis.  Please take the medication as prescribed.  Please follow-up with OB/GYN.  Please return for any new, worsening, or changing symptoms or other concerns.  It was a pleasure caring for you today.

## 2024-08-11 NOTE — ED Triage Notes (Addendum)
 Pt to ED via POV from home. Pt ambulatory to triage. Pt reports went to lift something up yesterday and felt pain in lower back. Pt reports pain has been continuous and now wraps around to her abdomen. Pt is G2P0 and currently [redacted]wks pregnant.   Pt reports on antibiotics for BV that has been ongoing but no new symptoms and not related to back pain that started yesterday after lifting something.

## 2024-08-13 ENCOUNTER — Ambulatory Visit: Payer: MEDICAID

## 2024-08-13 NOTE — Progress Notes (Unsigned)
    Return Prenatal Note   Subjective   18 y.o. G2P0010 at [redacted]w[redacted]d presents for this follow-up prenatal visit.  Patient reports back pain after feeling a pop in lower back when picking something up at work, pain did not resolve on its own & made it hard for her to walk. She was seen at the ED, improved some with tylenol & lidocaine patch, but pain is still present. She was also told she has BV & is concerned about the UA results-do they mean she has an infection. Patient reports: Movement: Present Contractions: Not present  Objective   Flow sheet Vitals: Pulse Rate: 95 BP: 103/76 Fetal Heart Rate (bpm): 145 Total weight gain: -3.2 oz (-0.091 kg)  General Appearance  No acute distress, well appearing, and well nourished Pulmonary   Normal work of breathing Musculoskeletal  Lower back non-tender to palpation, no bruising or evidence of injury Neurologic   Alert and oriented to person, place, and time Psychiatric   Mood and affect within normal limits   Assessment/Plan   Plan  18 y.o. G2P0010 at [redacted]w[redacted]d presents for follow-up OB visit. Reviewed prenatal record including previous visit note.  Encounter for supervision of normal first pregnancy in first trimester [Z34.01] Red flag symptoms & how to contact provider reviewed. Anatomy u/s scheduled. Reviewed normal dip UA today, leukocytes on UA in ED likely d/t BV. Continue metronidazole  as prescribed.  Back pain in pregnancy Comfort measures, stretches, warning signs reviewed.      Orders Placed This Encounter  Procedures   AFP, Serum, Open Spina Bifida    Is patient insulin dependent?:   No    Weight (lbs):   131    Gestational Age (GA), weeks:   16.5    Date on which patient was at this GA:   08/14/2024    GA Calculation Method:   LMP    Number of fetuses:   1    Donor egg?:   N    Release to patient:   Immediate [1]   Return in 4 weeks (on 09/11/2024) for ROB & Ultrasound.   Future Appointments  Date Time Provider  Department Center  08/17/2024 10:40 AM Franchot Isaiah LABOR, MD [facility]  08/28/2024  8:15 AM [facility] US  1 [facility] None  09/11/2024  9:35 AM Sebastian Sham, CNM [facility] None    For next visit:  continue with routine prenatal care     Harlene LITTIE Cisco, CNM  08/15/2511:06 PM

## 2024-08-14 ENCOUNTER — Ambulatory Visit: Payer: MEDICAID | Admitting: Certified Nurse Midwife

## 2024-08-14 ENCOUNTER — Encounter: Payer: Self-pay | Admitting: Certified Nurse Midwife

## 2024-08-14 VITALS — BP 103/76 | HR 95 | Wt 130.8 lb

## 2024-08-14 DIAGNOSIS — Z3401 Encounter for supervision of normal first pregnancy, first trimester: Secondary | ICD-10-CM

## 2024-08-14 DIAGNOSIS — Z3A16 16 weeks gestation of pregnancy: Secondary | ICD-10-CM | POA: Diagnosis not present

## 2024-08-14 DIAGNOSIS — M549 Dorsalgia, unspecified: Secondary | ICD-10-CM | POA: Diagnosis not present

## 2024-08-14 DIAGNOSIS — O99891 Other specified diseases and conditions complicating pregnancy: Secondary | ICD-10-CM

## 2024-08-14 DIAGNOSIS — Z3402 Encounter for supervision of normal first pregnancy, second trimester: Secondary | ICD-10-CM

## 2024-08-14 NOTE — Patient Instructions (Signed)
 Second Trimester of Pregnancy  The second trimester of pregnancy is from week 14 through week 27. This is months 4 through 6 of pregnancy. During the second trimester: Morning sickness is less or has stopped. You may have more energy. You may feel hungry more often. At this time, your unborn baby is growing very fast. At the end of the sixth month, the unborn baby may be up to 12 inches long and weigh about 1 pounds. You will likely start to feel the baby move between 16 and 20 weeks of pregnancy. Body changes during your second trimester Your body continues to change during this time. The changes usually go away after your baby is born. Physical changes You will gain more weight. Your belly will get bigger. You may begin to get stretch marks on your hips, belly, and breasts. Your breasts will keep growing and may hurt. You may get dark spots or blotches on your face. A dark line from your belly button to the pubic area may appear. This line is called linea nigra. Your hair may grow faster and get thicker. Health changes You may have headaches. You may have heartburn. You may pee more often. You may have swollen, bulging veins (varicose veins). You may have trouble pooping (constipation), or swollen veins in the butt that can itch or get painful (hemorrhoids). You may have back pain. This is caused by: Weight gain. Pregnancy hormones that are relaxing the joints in your pelvis. Follow these instructions at home: Medicines Talk to your health care provider if you're taking medicines. Ask if the medicines are safe to take during pregnancy. Your provider may change the medicines that you take. Do not take any medicines unless told to by your provider. Take a prenatal vitamin that has at least 600 micrograms (mcg) of folic acid. Do not use herbal medicines, illegal drugs, or medicines that are not approved by your provider. Eating and drinking While you're pregnant your body needs  extra food for your growing baby. Talk with your provider about what to eat while pregnant. Activity Most women are able to exercise during pregnancy. Exercises may need to change as your pregnancy goes on. Talk to your provider about your activities and exercise routines. Relieving pain and discomfort Wear a good, supportive bra if your breasts hurt. Rest with your legs raised if you have leg cramps or low back pain. Take warm sitz baths to soothe pain from hemorrhoids. Use hemorrhoid cream if your provider says it's okay. Do not douche. Do not use tampons or scented pads. Do not use hot tubs, steam rooms, or saunas. Safety Wear your seatbelt at all times when you're in a car. Talk to your provider if someone hits you, hurts you, or yells at you. Talk with your provider if you're feeling sad or have thoughts of hurting yourself. Lifestyle Certain things can be harmful while you're pregnant. It's best to avoid the following: Do not drink alcohol,smoke, vape, or use products with nicotine  or tobacco in them. If you need help quitting, talk with your provider. Avoid cat litter boxes and soil used by cats. These things carry germs that can cause harm to your pregnancy and your baby. General instructions Keep all follow-up visits. It helps you and your unborn baby stay as healthy as possible. Write down your questions. Take them to your prenatal visits. Your provider will: Talk with you about your overall health. Give you advice or refer you to specialists who can help with different needs,  including: Prenatal education classes. Mental health and counseling. Foods and healthy eating. Ask for help if you need help with food. Where to find more information American Pregnancy Association: americanpregnancy.org [employer]necologists: acog.org Office on [employer] Health: [employer]Less[employer].ca Contact a health care provider if: You have a headache that does not go away  when you take medicine. You have any of these problems: You can't eat or drink. You throw up or feel like you may throw up. You have watery poop (diarrhea) for 2 days or more. You have pain when you pee or your pee smells bad. You have been sick for 2 days or more and are not getting better. Contact your provider right away if: You have any of these coming from your vagina: Abnormal discharge. Bad-smelling fluid. Bleeding. Your baby is moving less than usual. You have contractions, belly cramping, or have pain in your pelvis or lower back. You have symptoms of high blood pressure or preeclampsia. These include: A severe, throbbing headache that does not go away. Sudden or extreme swelling of your face, hands, legs, or feet. Vision problems: You see spots. You have blurry vision. Your eyes are sensitive to light. If you can't reach the provider, go to an urgent care or emergency room. Get help right away if: You faint, become confused, or can't think clearly. You have chest pain or trouble breathing. You have any kind of injury, such as from a fall or a car crash. These symptoms may be an emergency. Call 911 right away. Do not wait to see if the symptoms will go away. Do not drive yourself to the hospital. This information is not intended to replace advice given to you by your health care provider. Make sure you discuss any questions you have with your health care provider. Document Revised: 06/02/2023 Document Reviewed: 12/31/2022 Elsevier Patient Education  2024 [employer] Inc[employer] Problems to Watch for During Pregnancy During pregnancy, your body goes through many changes. Some changes may be uncomfortable. But most changes are not a serious problem. It's important to learn when certain signs and symptoms may be a problem. Talk with your health care provider about any medical conditions you have. Make sure you know the symptoms to watch for. Reporting problems early will prevent  complications. Problems to watch for during pregnancy You're more likely to get an infection during pregnancy. Let your provider know if you have signs of infection, such as: A fever. A bad-smelling fluid from your vagina. Peeing too often, wanting to pee urgently, or pain when you pee. Also, let your provider know if: You're very tired, you feel dizzy, or you faint. You have watery poop (diarrhea) for 24 hours or longer. You throw up or feel like throwing up for 24 hours or longer. You have cramping in your belly or have pain in your hips or lower back. You have spotting, bleeding, or leaking of fluid from your vagina. You have pain, swelling, or redness in an arm or leg. You should also watch for signs of high blood pressure and preeclampsia. These signs can be very serious. They include: A headache that doesn't go away when you take medicine. Sudden or very bad swelling of your face, hands, legs, or feet. Problems seeing, such as: You see spots. You have blurry vision. You may be sensitive to light. Why it's important to watch for these problems Watching and reporting problems to your provider can help prevent complications that may affect you and your baby. These  include: Higher risk of giving birth early. Infection that may be passed on to your baby. Higher risk for stillbirth. Follow these instructions at home:  Take your medicines only as told. Keep all follow-up visits. Your provider needs to monitor your health and your baby's health. Where to find more information To learn more, go to these websites: Centers for Disease Control and Prevention (CDC) at TonerPromos.no. Then: Click Health Topics A-Z. Type urgent maternal warning signs in the search box. [employer]necologists (ACOG): acog.org Contact a health care provider if: You have any problems while you're pregnant. You feel your baby moving less than usual. You have any of these things: You  have strong emotions, such as sadness or anxiety, that affect your daily life. You do not feel safe in your home. You use tobacco, alcohol, or drugs, and you need help to stop. Get help right away if: You faint, have a seizure, or cannot think clearly. You have chest pain or difficulty breathing. You have any of the following symptoms and you were unable to reach your provider: You have symptoms of infection, including a fever, or have vaginal bleeding. You have symptoms of high blood pressure or preeclampsia. You have signs or symptoms of labor before 37 weeks of pregnancy. These include: Contractions that are 5 minutes or less apart, or that increase in frequency, intensity, or length. Sudden, sharp pain in the belly, or low back pain. Any amount of fluid that flows from your vagina without stopping. These symptoms may be an emergency. Call 911 right away. Do not wait to see if the symptoms will go away. Do not drive yourself to the hospital. This information is not intended to replace advice given to you by your health care provider. Make sure you discuss any questions you have with your health care provider. Document Revised: 02/08/2023 Document Reviewed: 02/08/2023 Elsevier Patient Education  2024 [employer].

## 2024-08-14 NOTE — Assessment & Plan Note (Addendum)
 Red flag symptoms & how to contact provider reviewed. Anatomy u/s scheduled. Reviewed normal dip UA today, leukocytes on UA in ED likely d/t BV. Continue metronidazole  as prescribed.

## 2024-08-14 NOTE — Assessment & Plan Note (Signed)
 Comfort measures, stretches, warning signs reviewed.

## 2024-08-16 LAB — AFP, SERUM, OPEN SPINA BIFIDA
AFP MoM: 0.71
AFP Value: 27.2 ng/mL
Gest. Age on Collection Date: 16.5 wk
Maternal Age At EDD: 20.5 a
OSBR Risk 1 IN: 10000
Test Results:: NEGATIVE
Weight: 131 [lb_av]

## 2024-08-17 ENCOUNTER — Ambulatory Visit: Payer: MEDICAID

## 2024-08-17 ENCOUNTER — Ambulatory Visit: Payer: Self-pay | Admitting: Certified Nurse Midwife

## 2024-08-17 VITALS — BP 103/66 | HR 84 | Temp 98.2°F | Ht 60.0 in | Wt 130.8 lb

## 2024-08-17 DIAGNOSIS — Z23 Encounter for immunization: Secondary | ICD-10-CM

## 2024-08-17 DIAGNOSIS — J302 Other seasonal allergic rhinitis: Secondary | ICD-10-CM | POA: Diagnosis not present

## 2024-08-17 DIAGNOSIS — J4520 Mild intermittent asthma, uncomplicated: Secondary | ICD-10-CM | POA: Insufficient documentation

## 2024-08-17 MED ORDER — CETIRIZINE HCL 10 MG PO TABS: 10.0000 mg | ORAL_TABLET | Freq: Every day | ORAL | 1 refills | Status: AC

## 2024-08-17 NOTE — Progress Notes (Signed)
 New patient visit   Patient: Karen Choi   DOB: [birth year]   18 y.o. Female  MRN: [id number] Visit Date: 08/17/2024  Today's healthcare provider: Isaiah DELENA Pepper, MD   Chief Complaint  Patient presents with   Establish Care    Patient presents to establish care with new PCP.    Subjective    Karen Choi is a 18 y.o. female who presents today as a new patient to establish care.   Discussed the use of AI scribe software for clinical note transcription with the patient, who gave verbal consent to proceed.  History of Present Illness Karen Choi is a 18 year old female who presents to establish care and discuss vaccinations during pregnancy.  She is currently [redacted] weeks pregnant and is concerned about vaccinations during pregnancy. She has not received the HPV, Tdap, or meningitis vaccines.  She has a history of asthma and uses an albuterol  inhaler as needed. She has two refills left and plans to obtain a new inhaler soon. She experiences shortness of breath, especially when active at work, and occasionally feels out of breath when walking or talking. Her symptoms are not currently severe but can worsen when she is sick.  She takes Zyrtec  for nasal congestion, which has been refilled.   She has a history of frequent respiratory illnesses and bronchitis during childhood, leading to a weak respiratory system. She recently had COVID-19, which exacerbated her respiratory issues, causing lung inflammation and difficulty breathing. She has not received the COVID-19 vaccine and prefers to wait until after pregnancy to receive the flu vaccine, as she typically gets sick after receiving it.  She works at [employer] and is cautious about exposure to illnesses, especially from children, and wears a mask to protect herself.   Past Medical History:  Diagnosis Date   Abdominal pain, RLQ (right lower quadrant) 05/19/2022   Asthma    BV (bacterial  vaginosis)    Chlamydia 05/15/2022   Past Surgical History:  Procedure Laterality Date   SKIN TAG REMOVAL     TONSILLECTOMY     Family Status  Relation Name Status   Mother  Alive   Father  Alive   MGM  Deceased   MGF  Deceased   PGM  Deceased   PGF  Deceased  No partnership data on file   Family History  Problem Relation Age of Onset   Breast cancer Mother        maybe 20   Hypertension Father    Cancer - Cervical Maternal Grandmother        unsure of age   Dementia Maternal Grandfather    Social History   Socioeconomic History   Marital status: Single    Spouse name: Not on file   Number of children: Not on file   Years of education: Not on file   Highest education level: Not on file  Occupational History   Not on file  Tobacco Use   Smoking status: Never    Passive exposure: Past   Smokeless tobacco: Former  Building Services Engineer status: Former  Substance and Sexual Activity   Alcohol use: Not Currently   Drug use: Not Currently    Frequency: 2.0 times per week    Types: Marijuana    Comment: Patient stopped using marijuana when she found out she was pregnant   Sexual activity: Yes    Birth control/protection: None  Other Topics Concern  Not on file  Social History Narrative   Not on file   Social Drivers of Health   Financial Resource Strain: Not on file  Food Insecurity: No Food Insecurity (06/29/2024)   Hunger Vital Sign    Worried About Running Out of Food in the Last Year: Never true    Ran Out of Food in the Last Year: Never true  Transportation Needs: No Transportation Needs (06/29/2024)   PRAPARE - Administrator, Civil Service (Medical): No    Lack of Transportation (Non-Medical): No  Physical Activity: Not on file  Stress: Not on file  Social Connections: Not on file   Outpatient Medications Prior to Visit  Medication Sig   albuterol  (VENTOLIN  HFA) 108 (90 Base) MCG/ACT inhaler Inhale 2 puffs into the lungs every 6 (six)  hours as needed for shortness of breath or wheezing.   metroNIDAZOLE  (METROGEL ) 0.75 % vaginal gel Place 1 Applicatorful vaginally at bedtime. Apply one applicatorful to vagina at bedtime for 5 days   ondansetron  (ZOFRAN -ODT) 4 MG disintegrating tablet Take 1 tablet (4 mg total) by mouth every 8 (eight) hours as needed for nausea or vomiting.   Prenatal Vit-Fe Fumarate-FA (MULTIVITAMIN-PRENATAL) 27-0.8 MG TABS tablet Take 1 tablet by mouth daily at 12 noon.   [DISCONTINUED] azithromycin  (ZITHROMAX ) 250 MG tablet Take 250 mg by mouth. (Patient not taking: Reported on 06/29/2024)   [DISCONTINUED] cetirizine  (ZYRTEC ) 10 MG tablet TAKE 1 TABLET BY MOUTH ONCE DAILY FOR ALLERGIES (Patient not taking: Reported on 08/17/2024)   [DISCONTINUED] famotidine  (PEPCID ) 20 MG tablet Take 1 tablet (20 mg total) by mouth 2 (two) times daily. (Patient not taking: Reported on 08/17/2024)   [DISCONTINUED] fluconazole  (DIFLUCAN ) 150 MG tablet Take 150 mg by mouth once. (Patient not taking: Reported on 06/29/2024)   [DISCONTINUED] ondansetron  (ZOFRAN -ODT) 4 MG disintegrating tablet Take 4 mg by mouth every 8 (eight) hours as needed. (Patient not taking: Reported on 06/29/2024)   No facility-administered medications prior to visit.   No Known Allergies  Reviews of Systems as noted in HPI.      Objective    BP 103/66 (BP Location: Left Arm, Patient Position: Sitting, Cuff Size: Normal)   Pulse 84   Temp 98.2 F (36.8 C) (Oral)   Ht 5' (1.524 m)   Wt 130 lb 12.8 oz (59.3 kg)   LMP 04/19/2024 (Exact Date)   SpO2 100%   BMI 25.55 kg/m     Physical Exam Constitutional:      Appearance: Normal appearance.  HENT:     Head: Normocephalic and atraumatic.     Mouth/Throat:     Mouth: Mucous membranes are moist.  Eyes:     Pupils: Pupils are equal, round, and reactive to light.  Cardiovascular:     Rate and Rhythm: Normal rate and regular rhythm.     Heart sounds: Normal heart sounds.  Pulmonary:      Effort: Pulmonary effort is normal.     Breath sounds: Normal breath sounds.  Skin:    General: Skin is warm.  Neurological:     General: No focal deficit present.     Mental Status: She is alert.     Depression Screen    08/17/2024   11:20 AM 06/29/2024    9:36 AM  PHQ 2/9 Scores  PHQ - 2 Score 0 0  PHQ- 9 Score 4    No results found for any visits on 08/17/24.  Assessment & Plan  Problem List Items Addressed This Visit       Respiratory   Mild intermittent asthma without complication - Primary     Other   Seasonal allergies   Relevant Medications   cetirizine  (ZYRTEC ) 10 MG tablet   Other Visit Diagnoses       Need for vaccination          Assessment & Plan Need for vaccination Patient would like to discuss vaccines. She is currently pregnant ([redacted]w[redacted]d) and follows with OB/GYN. Discussed TDAP, HPV, meningitis, and flu vaccines. - Delay TDAP vaccine until [redacted] weeks gestation, recommend patient to discuss with OB - Delay HPV and meningitis vaccines until after delivery. - Recommend flu and COVID vaccines, patient declines.  Asthma Chronic, well-controlled. Uses albuterol  inhaler as needed. Discussed potential for increased dyspnea due to pregnancy. - Continue albuterol  inhaler as needed. - Use nebulizer at home if preferred. - Seek acute care if experiencing wheezing or severe respiratory symptoms.  Other seasonal allergic rhinitis Chronic, well controlled. Nasal congestion managed with Zyrtec , symptoms well-controlled. - Continue Zyrtec  for nasal congestion   Return in about 1 year (around 08/17/2025) for Annual Physical Exam.      Isaiah DELENA Pepper, MD  [facility] [phone] (phone) [phone] (fax)

## 2024-08-28 ENCOUNTER — Other Ambulatory Visit: Payer: MEDICAID

## 2024-08-28 DIAGNOSIS — Z3401 Encounter for supervision of normal first pregnancy, first trimester: Secondary | ICD-10-CM

## 2024-08-28 DIAGNOSIS — Z369 Encounter for antenatal screening, unspecified: Secondary | ICD-10-CM

## 2024-09-11 ENCOUNTER — Telehealth: Payer: Self-pay

## 2024-09-11 ENCOUNTER — Encounter: Payer: MEDICAID | Admitting: Certified Nurse Midwife

## 2024-09-11 NOTE — Telephone Encounter (Signed)
 Spoke with patient. Lower abdominal pain described by patient sounds like Round ligament pain (normal). She states she has used the last of her metrogel  refill yesterday. She reports itching without odor. Advised could be yeast, we need to be sure before treating. Nurse visit scheduled for tomorrow at 3:15.

## 2024-09-11 NOTE — Patient Instructions (Incomplete)
 Second Trimester of Pregnancy  The second trimester of pregnancy is from week 14 through week 27. This is months 4 through 6 of pregnancy. During the second trimester: Morning sickness is less or has stopped. You may have more energy. You may feel hungry more often. At this time, your unborn baby is growing very fast. At the end of the sixth month, the unborn baby may be up to 12 inches long and weigh about 1 pounds. You will likely start to feel the baby move between 16 and 20 weeks of pregnancy. Body changes during your second trimester Your body continues to change during this time. The changes usually go away after your baby is born. Physical changes You will gain more weight. Your belly will get bigger. You may begin to get stretch marks on your hips, belly, and breasts. Your breasts will keep growing and may hurt. You may get dark spots or blotches on your face. A dark line from your belly button to the pubic area may appear. This line is called linea nigra. Your hair may grow faster and get thicker. Health changes You may have headaches. You may have heartburn. You may pee more often. You may have swollen, bulging veins (varicose veins). You may have trouble pooping (constipation), or swollen veins in the butt that can itch or get painful (hemorrhoids). You may have back pain. This is caused by: Weight gain. Pregnancy hormones that are relaxing the joints in your pelvis. Follow these instructions at home: Medicines Talk to your health care provider if you're taking medicines. Ask if the medicines are safe to take during pregnancy. Your provider may change the medicines that you take. Do not take any medicines unless told to by your provider. Take a prenatal vitamin that has at least 600 micrograms (mcg) of folic acid. Do not use herbal medicines, illegal drugs, or medicines that are not approved by your provider. Eating and drinking While you're pregnant your body needs  extra food for your growing baby. Talk with your provider about what to eat while pregnant. Activity Most women are able to exercise during pregnancy. Exercises may need to change as your pregnancy goes on. Talk to your provider about your activities and exercise routines. Relieving pain and discomfort Wear a good, supportive bra if your breasts hurt. Rest with your legs raised if you have leg cramps or low back pain. Take warm sitz baths to soothe pain from hemorrhoids. Use hemorrhoid cream if your provider says it's okay. Do not douche. Do not use tampons or scented pads. Do not use hot tubs, steam rooms, or saunas. Safety Wear your seatbelt at all times when you're in a car. Talk to your provider if someone hits you, hurts you, or yells at you. Talk with your provider if you're feeling sad or have thoughts of hurting yourself. Lifestyle Certain things can be harmful while you're pregnant. It's best to avoid the following: Do not drink alcohol,smoke, vape, or use products with nicotine or tobacco in them. If you need help quitting, talk with your provider. Avoid cat litter boxes and soil used by cats. These things carry germs that can cause harm to your pregnancy and your baby. General instructions Keep all follow-up visits. It helps you and your unborn baby stay as healthy as possible. Write down your questions. Take them to your prenatal visits. Your provider will: Talk with you about your overall health. Give you advice or refer you to specialists who can help with different needs,  including: Prenatal education classes. Mental health and counseling. Foods and healthy eating. Ask for help if you need help with food. Where to find more information American Pregnancy Association: americanpregnancy.org [employer]nec[employer]: acog.org Office on [employer] Health: TravelLesson.ca Contact a health care provider if: You have a headache that does not go away  when you take medicine. You have any of these problems: You can't eat or drink. You throw up or feel like you may throw up. You have watery poop (diarrhea) for 2 days or more. You have pain when you pee or your pee smells bad. You have been sick for 2 days or more and are not getting better. Contact your provider right away if: You have any of these coming from your vagina: Abnormal discharge. Bad-smelling fluid. Bleeding. Your baby is moving less than usual. You have contractions, belly cramping, or have pain in your pelvis or lower back. You have symptoms of high blood pressure or preeclampsia. These include: A severe, throbbing headache that does not go away. Sudden or extreme swelling of your face, hands, legs, or feet. Vision problems: You see spots. You have blurry vision. Your eyes are sensitive to light. If you can't reach the provider, go to an urgent care or emergency room. Get help right away if: You faint, become confused, or can't think clearly. You have chest pain or trouble breathing. You have any kind of injury, such as from a fall or a car crash. These symptoms may be an emergency. Call 911 right away. Do not wait to see if the symptoms will go away. Do not drive yourself to the hospital. This information is not intended to replace advice given to you by your health care provider. Make sure you discuss any questions you have with your health care provider. Document Revised: 06/02/2023 Document Reviewed: 12/31/2022 Elsevier Patient Education  2024 [employer].

## 2024-09-11 NOTE — Telephone Encounter (Signed)
 TRIAGE VOICEMAIL: Patient states she is having pain and doesn't know if it's normal or not. She also states she believes she has BV again. Requesting refill of metroNIDAZOLE  (METROGEL ) 0.75 % vaginal gel .

## 2024-09-12 ENCOUNTER — Ambulatory Visit: Payer: MEDICAID

## 2024-09-12 VITALS — BP 97/72 | HR 114 | Ht 60.5 in | Wt 135.0 lb

## 2024-09-12 DIAGNOSIS — N898 Other specified noninflammatory disorders of vagina: Secondary | ICD-10-CM

## 2024-09-12 NOTE — Progress Notes (Signed)
" ° ° °  NURSE VISIT NOTE  Subjective:    Patient ID: Karen Choi, female    DOB: 2005/11/24, 18 y.o.   MRN: [id number]  HPI  Patient is a 18 y.o. [id number] female who presents for  vaginal itching for 1 week.  Denies abnormal vaginal bleeding or significant pelvic pain or fever. denies dysuria, hematuria, urinary frequency, urinary urgency, flank pain, abdominal pain, cloudy malordorous urine, genital rash, and genital irritation. Patient denies a history of known exposure to STD.   Objective:    LMP 04/19/2024 (Exact Date)    No results found for any visits on 09/12/24.  Assessment:   1. Vaginal itching       Plan:   GC and chlamydia DNA  probe sent to lab. Treatment: abstain from coitus during course of treatment ROV prn if symptoms persist or worsen.   Harlene Gander, CMA  "

## 2024-09-14 LAB — NUSWAB VG PLUS+MYCOPLASMAS,NAA
Candida albicans, NAA: NEGATIVE
Candida glabrata, NAA: NEGATIVE
Chlamydia trachomatis, NAA: NEGATIVE
Mycoplasma genitalium NAA: NEGATIVE
Mycoplasma hominis NAA: NEGATIVE
Neisseria gonorrhoeae, NAA: NEGATIVE
Trich vag by NAA: NEGATIVE
Ureaplasma spp NAA: POSITIVE — AB

## 2024-09-17 DIAGNOSIS — O219 Vomiting of pregnancy, unspecified: Secondary | ICD-10-CM

## 2024-09-17 DIAGNOSIS — Z3401 Encounter for supervision of normal first pregnancy, first trimester: Secondary | ICD-10-CM

## 2024-09-17 DIAGNOSIS — A493 Mycoplasma infection, unspecified site: Secondary | ICD-10-CM

## 2024-09-17 MED ORDER — AZITHROMYCIN 250 MG PO TABS: ORAL_TABLET | ORAL | 1 refills | Status: DC

## 2024-09-17 MED ORDER — ONDANSETRON 4 MG PO TBDP: 4.0000 mg | ORAL_TABLET | Freq: Three times a day (TID) | ORAL | 1 refills | Status: AC | PRN

## 2024-09-17 NOTE — Progress Notes (Signed)
 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  Pt also requests refill of nausea medication.

## 2024-09-19 NOTE — Patient Instructions (Signed)
 Second Trimester of Pregnancy  The second trimester of pregnancy is from week 14 through week 27. This is months 4 through 6 of pregnancy. During the second trimester: Morning sickness is less or has stopped. You may have more energy. You may feel hungry more often. At this time, your unborn baby is growing very fast. At the end of the sixth month, the unborn baby may be up to 12 inches long and weigh about 1 pounds. You will likely start to feel the baby move between 16 and 20 weeks of pregnancy. Body changes during your second trimester Your body continues to change during this time. The changes usually go away after your baby is born. Physical changes You will gain more weight. Your belly will get bigger. You may begin to get stretch marks on your hips, belly, and breasts. Your breasts will keep growing and may hurt. You may get dark spots or blotches on your face. A dark line from your belly button to the pubic area may appear. This line is called linea nigra. Your hair may grow faster and get thicker. Health changes You may have headaches. You may have heartburn. You may pee more often. You may have swollen, bulging veins (varicose veins). You may have trouble pooping (constipation), or swollen veins in the butt that can itch or get painful (hemorrhoids). You may have back pain. This is caused by: Weight gain. Pregnancy hormones that are relaxing the joints in your pelvis. Follow these instructions at home: Medicines Talk to your health care provider if you're taking medicines. Ask if the medicines are safe to take during pregnancy. Your provider may change the medicines that you take. Do not take any medicines unless told to by your provider. Take a prenatal vitamin that has at least 600 micrograms (mcg) of folic acid. Do not use herbal medicines, illegal drugs, or medicines that are not approved by your provider. Eating and drinking While you're pregnant your body needs  extra food for your growing baby. Talk with your provider about what to eat while pregnant. Activity Most women are able to exercise during pregnancy. Exercises may need to change as your pregnancy goes on. Talk to your provider about your activities and exercise routines. Relieving pain and discomfort Wear a good, supportive bra if your breasts hurt. Rest with your legs raised if you have leg cramps or low back pain. Take warm sitz baths to soothe pain from hemorrhoids. Use hemorrhoid cream if your provider says it's okay. Do not douche. Do not use tampons or scented pads. Do not use hot tubs, steam rooms, or saunas. Safety Wear your seatbelt at all times when you're in a car. Talk to your provider if someone hits you, hurts you, or yells at you. Talk with your provider if you're feeling sad or have thoughts of hurting yourself. Lifestyle Certain things can be harmful while you're pregnant. It's best to avoid the following: Do not drink alcohol,smoke, vape, or use products with nicotine or tobacco in them. If you need help quitting, talk with your provider. Avoid cat litter boxes and soil used by cats. These things carry germs that can cause harm to your pregnancy and your baby. General instructions Keep all follow-up visits. It helps you and your unborn baby stay as healthy as possible. Write down your questions. Take them to your prenatal visits. Your provider will: Talk with you about your overall health. Give you advice or refer you to specialists who can help with different needs,  including: Prenatal education classes. Mental health and counseling. Foods and healthy eating. Ask for help if you need help with food. Where to find more information American Pregnancy Association: americanpregnancy.org [employer]: acog.org Office on [employer] Health: [employer] Contact a health care provider if: You have a headache that does not go away  when you take medicine. You have any of these problems: You can't eat or drink. You throw up or feel like you may throw up. You have watery poop (diarrhea) for 2 days or more. You have pain when you pee or your pee smells bad. You have been sick for 2 days or more and are not getting better. Contact your provider right away if: You have any of these coming from your vagina: Abnormal discharge. Bad-smelling fluid. Bleeding. Your baby is moving less than usual. You have contractions, belly cramping, or have pain in your pelvis or lower back. You have symptoms of high blood pressure or preeclampsia. These include: A severe, throbbing headache that does not go away. Sudden or extreme swelling of your face, hands, legs, or feet. Vision problems: You see spots. You have blurry vision. Your eyes are sensitive to light. If you can't reach the provider, go to an urgent care or emergency room. Get help right away if: You faint, become confused, or can't think clearly. You have chest pain or trouble breathing. You have any kind of injury, such as from a fall or a car crash. These symptoms may be an emergency. Call 911 right away. Do not wait to see if the symptoms will go away. Do not drive yourself to the hospital. This information is not intended to replace advice given to you by your health care provider. Make sure you discuss any questions you have with your health care provider. Document Revised: 06/02/2023 Document Reviewed: 12/31/2022 Elsevier Patient Education  2024 [employer].

## 2024-09-20 ENCOUNTER — Ambulatory Visit: Payer: MEDICAID | Admitting: Certified Nurse Midwife

## 2024-09-20 VITALS — BP 97/64 | HR 97 | Wt 137.4 lb

## 2024-09-20 DIAGNOSIS — Z3482 Encounter for supervision of other normal pregnancy, second trimester: Secondary | ICD-10-CM | POA: Diagnosis not present

## 2024-09-20 DIAGNOSIS — Z3A22 22 weeks gestation of pregnancy: Secondary | ICD-10-CM

## 2024-09-20 NOTE — Progress Notes (Signed)
 Rob doing well, feeling good movement. Pt asked about additional u/s in pregnancy. Review that typically if her pregnancy remains normal then additional u/s not medically necessary. Should she develop a complication then a U/s maybe needed. She verbalizes understanding and agree. Discussed glucose screening next visit . She agrees. Follow up for glucose and rob 27 weeks.   Karen Choi, CNM

## 2024-09-25 ENCOUNTER — Encounter: Payer: Self-pay | Admitting: Certified Nurse Midwife

## 2024-09-29 ENCOUNTER — Observation Stay
Admission: EM | Admit: 2024-09-29 | Discharge: 2024-09-29 | Disposition: A | Discharge status: Home / Self Care | Payer: MEDICAID | Attending: Obstetrics | Admitting: Obstetrics

## 2024-09-29 ENCOUNTER — Other Ambulatory Visit: Payer: Self-pay

## 2024-09-29 DIAGNOSIS — O26892 Other specified pregnancy related conditions, second trimester: Principal | ICD-10-CM

## 2024-09-29 DIAGNOSIS — Z3A23 23 weeks gestation of pregnancy: Secondary | ICD-10-CM

## 2024-09-29 DIAGNOSIS — K5900 Constipation, unspecified: Secondary | ICD-10-CM | POA: Diagnosis not present

## 2024-09-29 DIAGNOSIS — R109 Unspecified abdominal pain: Secondary | ICD-10-CM

## 2024-09-29 LAB — URINALYSIS, COMPLETE (UACMP) WITH MICROSCOPIC
Bilirubin Urine: NEGATIVE
Glucose, UA: NEGATIVE mg/dL
Hgb urine dipstick: NEGATIVE
Ketones, ur: NEGATIVE mg/dL
Leukocytes,Ua: NEGATIVE
Nitrite: NEGATIVE
Protein, ur: NEGATIVE mg/dL
Specific Gravity, Urine: 1.013 (ref 1.005–1.030)
pH: 8 (ref 5.0–8.0)

## 2024-09-29 MED ORDER — LACTATED RINGERS IV SOLN: 500.0000 mL | INTRAVENOUS | Status: DC | PRN

## 2024-09-29 MED ORDER — POLYETHYLENE GLYCOL 3350 17 GM/SCOOP PO POWD: 17.0000 g | Freq: Every day | ORAL | 0 refills | Status: AC

## 2024-09-29 MED ORDER — ACETAMINOPHEN 325 MG PO TABS: 650.0000 mg | ORAL_TABLET | ORAL | Status: DC | PRN

## 2024-09-29 MED ORDER — DOCUSATE SODIUM 100 MG PO CAPS: 100.0000 mg | ORAL_CAPSULE | Freq: Two times a day (BID) | ORAL | 3 refills | Status: AC

## 2024-09-29 NOTE — Progress Notes (Signed)
 Discharge instructions provided to pt and prescriptions reviewed. Pt verbalizes understanding. Vaginal bleeding and discharge, contractions reviewed by RN. Follow-up care reviewed. Pt discharged home with significant other.

## 2024-09-29 NOTE — OB Triage Note (Addendum)
 LABOR & DELIVERY OB TRIAGE NOTE  SUBJECTIVE  HPI Karen Choi is a 19 y.o. G2P0010 at [redacted]w[redacted]d who presents to Labor & Delivery for abdominal pain. She reports that she has been cramping since the beginning of pregnancy. She has been treated for BV and ureaplasma (completed treatment approximately 09/22/24). She reports that the pain is suprapubic but also wraps all around and sometimes goes into her rectum. She denies bleeding, LOF, and abnormal discharge. She denies dysuria but does report increased urinary frequency. She has not been having normal bowel movements. She reports that her stool is hard and pellet-like. She is feeling normal fetal movement.  OB History     Gravida  2   Para  0   Term  0   Preterm  0   AB  1   Living  0      SAB  0   IAB  1   Ectopic  0   Multiple  0   Live Births  0           Scheduled Meds: Continuous Infusions:  lactated ringers      PRN Meds:.acetaminophen , lactated ringers   OBJECTIVE  BP 106/60 (BP Location: Right Arm)   Pulse 92   Temp 98.5 F (36.9 C) (Axillary)   Resp 16   Ht 5' 3.5 (1.613 m)   Wt 61.2 kg   LMP 04/19/2024 (Exact Date)   BMI 23.54 kg/m   General: alert, cooperative, NAD Abdomen: soft, gravid, non-tender Cervical exam: deferred  FHT: 145  UA: negative leuks/nitrites/blood. Rare bacteria.  ASSESSMENT Impression  1) Pregnancy at G2P0010, [redacted]w[redacted]d, Estimated Date of Delivery: 01/24/25 2) Reassuring maternal/fetal status 3) Abdominal pain, constipation  PLAN 1) Discharge home with standard labor/return precautions 2) Bowel regimen: Colace BID, Miralax  daily until having regular, soft bowel movements. Encouraged to increase fluids, fiber, and fresh fruits/veggies,  warm apple juice, etc.  3) Keep scheduled ROB appt. TOC at next visit.  Eleanor Canny, CNM 09/29/24  1:45 PM

## 2024-09-29 NOTE — OB Triage Note (Signed)
 Pt G2P1 [redacted]w[redacted]d presents for lower abdominal/pelvic pain. She reports when she was at work yesterday around 6pm and she took a few steps and had 10/10 lower abdominal/pelvic pain where she couldn't walk due to pain. Pain has decreased to 7/10. Pt has not tried any interventions to help with pain. Reports +FM and denies LOF/bledding. Reports history of BV and diagnosed with ureaplasma. Pt has had limited water intake. Initial FHT 145. Toco applied.

## 2024-10-12 ENCOUNTER — Ambulatory Visit: Payer: MEDICAID

## 2024-10-25 ENCOUNTER — Encounter: Payer: MEDICAID | Admitting: Certified Nurse Midwife

## 2024-10-25 ENCOUNTER — Other Ambulatory Visit: Payer: MEDICAID
